# Patient Record
Sex: Male | Born: 1955 | Race: White | Hispanic: No | Marital: Married | Smoking: Current every day smoker
Health system: Southern US, Community
[De-identification: ages and names within clinical notes are randomized; demographics above are authoritative.]

## PROBLEM LIST (undated history)

## (undated) DIAGNOSIS — S1091XA Abrasion of unspecified part of neck, initial encounter: Secondary | ICD-10-CM

## (undated) DIAGNOSIS — L089 Local infection of the skin and subcutaneous tissue, unspecified: Secondary | ICD-10-CM

## (undated) DIAGNOSIS — F909 Attention-deficit hyperactivity disorder, unspecified type: Secondary | ICD-10-CM

## (undated) DIAGNOSIS — R591 Generalized enlarged lymph nodes: Secondary | ICD-10-CM

## (undated) HISTORY — DX: Abrasion of unspecified part of neck, initial encounter: S10.91XA

## (undated) HISTORY — PX: APPENDECTOMY: SHX54

## (undated) HISTORY — DX: Attention-deficit hyperactivity disorder, unspecified type: F90.9

## (undated) HISTORY — PX: VASECTOMY: SHX75

## (undated) HISTORY — DX: Local infection of the skin and subcutaneous tissue, unspecified: L08.9

## (undated) HISTORY — PX: KNEE ARTHROSCOPY: SHX127

---

## 2004-10-06 ENCOUNTER — Ambulatory Visit: Payer: Self-pay | Admitting: Family Medicine

## 2004-10-09 ENCOUNTER — Ambulatory Visit: Payer: Self-pay | Admitting: Cardiology

## 2005-10-31 ENCOUNTER — Ambulatory Visit: Payer: Self-pay | Admitting: Internal Medicine

## 2006-09-05 ENCOUNTER — Ambulatory Visit: Payer: Self-pay | Admitting: Internal Medicine

## 2006-09-05 ENCOUNTER — Encounter: Payer: Self-pay | Admitting: Internal Medicine

## 2006-09-05 DIAGNOSIS — N509 Disorder of male genital organs, unspecified: Secondary | ICD-10-CM | POA: Insufficient documentation

## 2006-09-05 LAB — CONVERTED CEMR LAB
Alkaline Phosphatase: 51 units/L (ref 39–117)
BUN: 12 mg/dL (ref 6–23)
Basophils Absolute: 0.1 10*3/uL (ref 0.0–0.1)
Chloride: 105 meq/L (ref 96–112)
Cholesterol: 191 mg/dL (ref 0–200)
Creatinine, Ser: 0.8 mg/dL (ref 0.4–1.5)
Eosinophils Absolute: 0.2 10*3/uL (ref 0.0–0.6)
Eosinophils Relative: 4.3 % (ref 0.0–5.0)
GFR calc Af Amer: 132 mL/min
MCV: 87.8 fL (ref 78.0–100.0)
Monocytes Absolute: 0.2 10*3/uL (ref 0.2–0.7)
Neutro Abs: 3.3 10*3/uL (ref 1.4–7.7)
RBC: 4.75 M/uL (ref 4.22–5.81)
Total Bilirubin: 0.7 mg/dL (ref 0.3–1.2)
Total CHOL/HDL Ratio: 5.1
Triglycerides: 78 mg/dL (ref 0–149)
WBC: 5.5 10*3/uL (ref 4.5–10.5)

## 2006-09-27 ENCOUNTER — Encounter: Admission: RE | Admit: 2006-09-27 | Discharge: 2006-09-27 | Payer: Self-pay | Admitting: Internal Medicine

## 2006-11-06 ENCOUNTER — Encounter: Payer: Self-pay | Admitting: Internal Medicine

## 2006-11-06 ENCOUNTER — Ambulatory Visit: Payer: Self-pay | Admitting: Internal Medicine

## 2006-11-26 ENCOUNTER — Encounter: Payer: Self-pay | Admitting: Internal Medicine

## 2008-09-28 ENCOUNTER — Encounter (INDEPENDENT_AMBULATORY_CARE_PROVIDER_SITE_OTHER): Payer: Self-pay | Admitting: Orthopedic Surgery

## 2008-09-28 ENCOUNTER — Ambulatory Visit: Admission: RE | Admit: 2008-09-28 | Discharge: 2008-09-28 | Payer: Self-pay | Admitting: Orthopedic Surgery

## 2008-09-28 ENCOUNTER — Ambulatory Visit: Payer: Self-pay | Admitting: Surgery

## 2009-01-19 ENCOUNTER — Emergency Department (HOSPITAL_BASED_OUTPATIENT_CLINIC_OR_DEPARTMENT_OTHER): Admission: EM | Admit: 2009-01-19 | Discharge: 2009-01-19 | Payer: Self-pay | Admitting: Emergency Medicine

## 2009-08-02 ENCOUNTER — Ambulatory Visit: Payer: Self-pay | Admitting: Internal Medicine

## 2009-08-02 ENCOUNTER — Telehealth (INDEPENDENT_AMBULATORY_CARE_PROVIDER_SITE_OTHER): Payer: Self-pay | Admitting: *Deleted

## 2009-08-02 DIAGNOSIS — R319 Hematuria, unspecified: Secondary | ICD-10-CM | POA: Insufficient documentation

## 2009-08-02 LAB — CONVERTED CEMR LAB: Bilirubin Urine: NEGATIVE

## 2009-08-03 ENCOUNTER — Encounter: Payer: Self-pay | Admitting: Internal Medicine

## 2009-08-04 ENCOUNTER — Encounter: Payer: Self-pay | Admitting: Internal Medicine

## 2009-08-04 LAB — CONVERTED CEMR LAB
Casts: NONE SEEN /lpf
RBC / HPF: NONE SEEN (ref <3)
WBC, UA: NONE SEEN cells/hpf (ref <3)

## 2009-08-05 ENCOUNTER — Ambulatory Visit: Payer: Self-pay | Admitting: Diagnostic Radiology

## 2009-08-05 ENCOUNTER — Ambulatory Visit (HOSPITAL_BASED_OUTPATIENT_CLINIC_OR_DEPARTMENT_OTHER): Admission: RE | Admit: 2009-08-05 | Discharge: 2009-08-05 | Payer: Self-pay | Admitting: Internal Medicine

## 2009-08-05 ENCOUNTER — Ambulatory Visit: Payer: Self-pay | Admitting: Internal Medicine

## 2009-08-15 ENCOUNTER — Encounter (INDEPENDENT_AMBULATORY_CARE_PROVIDER_SITE_OTHER): Payer: Self-pay | Admitting: *Deleted

## 2009-09-09 ENCOUNTER — Ambulatory Visit: Payer: Self-pay | Admitting: Internal Medicine

## 2009-09-09 DIAGNOSIS — N453 Epididymo-orchitis: Secondary | ICD-10-CM | POA: Insufficient documentation

## 2009-09-09 LAB — CONVERTED CEMR LAB
Bilirubin Urine: NEGATIVE
Glucose, Urine, Semiquant: NEGATIVE
Ketones, urine, test strip: NEGATIVE
Nitrite: NEGATIVE
Protein, U semiquant: NEGATIVE
Urobilinogen, UA: 0.2
WBC Urine, dipstick: NEGATIVE

## 2009-09-10 ENCOUNTER — Encounter: Payer: Self-pay | Admitting: Internal Medicine

## 2009-09-10 LAB — CONVERTED CEMR LAB: Casts: NONE SEEN /lpf

## 2009-09-26 ENCOUNTER — Ambulatory Visit: Payer: Self-pay | Admitting: Internal Medicine

## 2009-09-26 DIAGNOSIS — F909 Attention-deficit hyperactivity disorder, unspecified type: Secondary | ICD-10-CM | POA: Insufficient documentation

## 2009-09-29 LAB — CONVERTED CEMR LAB
BUN: 13 mg/dL (ref 6–23)
Basophils Absolute: 0.2 10*3/uL — ABNORMAL HIGH (ref 0.0–0.1)
CO2: 29 meq/L (ref 19–32)
Chloride: 106 meq/L (ref 96–112)
Creatinine, Ser: 0.8 mg/dL (ref 0.4–1.5)
Eosinophils Absolute: 0.1 10*3/uL (ref 0.0–0.7)
GFR calc non Af Amer: 107.84 mL/min (ref >60)
Glucose, Bld: 87 mg/dL (ref 70–99)
Hemoglobin: 13.4 g/dL (ref 13.0–17.0)
Monocytes Absolute: 0.8 10*3/uL (ref 0.1–1.0)
Monocytes Relative: 17.2 % — ABNORMAL HIGH (ref 3.0–12.0)
Neutro Abs: 1.4 10*3/uL (ref 1.4–7.7)
RBC: 4.5 M/uL (ref 4.22–5.81)
RDW: 13 % (ref 11.5–14.6)
TSH: 0.96 microintl units/mL (ref 0.35–5.50)

## 2009-10-07 ENCOUNTER — Ambulatory Visit: Payer: Self-pay | Admitting: *Deleted

## 2009-10-10 ENCOUNTER — Telehealth (INDEPENDENT_AMBULATORY_CARE_PROVIDER_SITE_OTHER): Payer: Self-pay | Admitting: *Deleted

## 2009-10-24 ENCOUNTER — Ambulatory Visit: Payer: Self-pay | Admitting: Internal Medicine

## 2009-10-27 ENCOUNTER — Ambulatory Visit: Payer: Self-pay | Admitting: Internal Medicine

## 2009-11-02 ENCOUNTER — Ambulatory Visit: Payer: Self-pay | Admitting: Internal Medicine

## 2009-11-02 ENCOUNTER — Telehealth (INDEPENDENT_AMBULATORY_CARE_PROVIDER_SITE_OTHER): Payer: Self-pay | Admitting: *Deleted

## 2009-11-02 LAB — CONVERTED CEMR LAB
Albumin: 4.2 g/dL (ref 3.5–5.2)
Alkaline Phosphatase: 57 units/L (ref 39–117)
Bilirubin, Direct: 0 mg/dL (ref 0.0–0.3)
Glucose, Urine, Semiquant: NEGATIVE
HDL: 42.8 mg/dL (ref >39.00)
PSA: 0.38 ng/mL (ref 0.10–4.00)
Protein, U semiquant: NEGATIVE
Total Protein: 6.9 g/dL (ref 6.0–8.3)
Triglycerides: 101 mg/dL (ref 0.0–149.0)
Urobilinogen, UA: 0.2
pH: 6

## 2009-11-03 ENCOUNTER — Encounter: Payer: Self-pay | Admitting: Internal Medicine

## 2009-11-03 LAB — CONVERTED CEMR LAB: Crystals: NONE SEEN

## 2009-11-24 ENCOUNTER — Telehealth (INDEPENDENT_AMBULATORY_CARE_PROVIDER_SITE_OTHER): Payer: Self-pay | Admitting: *Deleted

## 2009-11-30 ENCOUNTER — Ambulatory Visit: Payer: Self-pay | Admitting: Internal Medicine

## 2009-11-30 DIAGNOSIS — M549 Dorsalgia, unspecified: Secondary | ICD-10-CM | POA: Insufficient documentation

## 2009-12-07 ENCOUNTER — Encounter: Payer: Self-pay | Admitting: Internal Medicine

## 2010-01-19 ENCOUNTER — Encounter: Payer: Self-pay | Admitting: Internal Medicine

## 2010-08-01 NOTE — Letter (Signed)
Summary: chronic bacterial prostatitis--Urology    [facility]   Imported By: Lanelle Bal 12/15/2009 08:26:09  _____________________________________________________________________  External Attachment:    Type:   Image     Comment:   External Document

## 2010-08-02 NOTE — Letter (Signed)
Summary: chronic prostatitis-- Urology Specialists  [facility]   Imported By: Lanelle Bal 01/26/2010 13:54:01  _____________________________________________________________________  External Attachment:    Type:   Image     Comment:   External Document

## 2010-08-02 NOTE — Progress Notes (Signed)
Summary: re pain/Dr Drue Novel see  Phone Note Call from Patient Call back at [phone]   Caller: Patient Summary of Call: pt seen 11/02/09, signs are back pain in groin area, says woke up in a sweat last night, was told to call if signs come back. Urine cx was neg; UROLOGY REFERRAL? Initial call taken by: Christopher Moses,  Nov 24, 2009 2:24 PM  Follow-up for Phone Call        please schedule a visit with me early next week, ER if symptoms severe Christopher E. Paz MD  Nov 25, 2009 3:03 PM   Pt informed and OV scheduled .Christopher Moses  Nov 25, 2009 3:12 PM  Follow-up by: Christopher Moses,  Nov 25, 2009 3:12 PM

## 2010-08-02 NOTE — Assessment & Plan Note (Signed)
Summary: testicular pain//lch   Vital Signs:  Patient profile:   55 year old male Height:      68 inches Weight:      175.38 pounds Pulse rate:   88 / minute BP sitting:   110 / 70  Vitals Entered By: Kandice Hams (September 09, 2009 12:46 PM) CC: c/o swollen,sore prostate   History of Present Illness: one-week history of right testicular discomfort and  ?slt.  swelling the patient took over-the-counter saw palmetto and the symptoms decreased for a couple of days but they returned the right testicle is not hurting but is tender to palpation,  the size has not go down  he has some urinary frequency but that is at baseline no   injury in the genital area   Allergies: 1)  ! Codeine Sulfate (Codeine Sulfate)  Past History:  Past Medical History: Reviewed history from 08/02/2009 and no changes required. no problems   Past Surgical History: Reviewed history from 08/02/2009 and no changes required. Appendectomy  Social History: Reviewed history from 08/02/2009 and no changes required. married children x 4 Current Smoker ETOH-- socially  Review of Systems       denies fever   GI:  no nausea vomiting or diarrhea. GU:  no dysuria, hematuria, difficulty urinating no penile discharge no high-risk sexual activity.  Physical Exam  General:  alert, well-developed, and well-nourished.   Abdomen:  soft, non-tender, no distention, no masses, no guarding, no rigidity, and no inguinal hernia.  no CVA tenderness Rectal:  No external abnormalities noted. Normal sphincter tone. No rectal masses or tenderness. Genitalia:  left testicle normal to palpation, nontender right testicle slightly larger, moderately sensitive to palpation. no actual testicular mass that I can tell epididymus is quite nodular bilaterally and nontender  circumcised, no cutaneous lesions, and no urethral discharge.   Prostate:  Prostate gland firm and smooth, no enlargement, nodularity, tenderness, mass,  asymmetry or induration.   Impression & Recommendations:  Problem # 1:  ORCHITIS (ICD-604.90) Assessment New symptoms and  physical exam consistent with orchitis the right testicle is  slightly larger however the patient states that that has been the case for a while Plan: UA and urine culture, start antibiotics if the symptoms continue will get a ultrasound of the scrotum reassess in 4  weeks     Problem # 2:  due for a physical exam, patient aware  Complete Medication List: 1)  Neurontin 300 Mg Caps (Gabapentin) .... One by mouth at bedtime 2)  Ciprofloxacin Hcl 500 Mg Tabs (Ciprofloxacin hcl) .... One by mouth twice a day  Other Orders: Specimen Handling (04540) T-Urine Microscopic ([id number]) T-Culture, Urine ([id number]) UA Dipstick w/o Micro (manual) (78469)  Patient Instructions: 1)  Please schedule a follow-up appointment in 4  weeks.  Prescriptions: CIPROFLOXACIN HCL 500 MG TABS (CIPROFLOXACIN HCL) one by mouth twice a day  #20 x 0   Entered and Authorized by:   Nolon Rod. Asianna Brundage MD   Signed by:   Nolon Rod. Cordney Barstow MD on 09/09/2009   Method used:   Print then Give to Patient   RxID:   [id number]   Laboratory Results   Urine Tests    Routine Urinalysis   Glucose: negative   (Normal Range: Negative) Bilirubin: negative   (Normal Range: Negative) Ketone: negative   (Normal Range: Negative) Spec. Gravity: 1.020   (Normal Range: 1.003-1.035) Blood: small   (Normal Range: Negative) pH: 7.0   (Normal Range: 5.0-8.0) Protein: negative   (Normal  Range: Negative) Urobilinogen: 0.2   (Normal Range: 0-1) Nitrite: negative   (Normal Range: Negative) Leukocyte Esterace: negative   (Normal Range: Negative)

## 2010-08-02 NOTE — Progress Notes (Signed)
Summary: lab results  Phone Note Outgoing Call Call back at Work Phone [phone] Call back at [phone]   Details for Reason: LAB RESULTS: advise patient: is cholesterol is very high, LDL is 175.9, ideal LDL should be close to 100 (in light of his family history plus tobacco abuse) In addition to diet and exercise I recommend simvastatin 40 mg daily FLP , AST ALT in 6 weeks Signed by Elita Quick E. Paz MD on 11/02/2009 at 10:25 AM  Summary of Call: left message on machine for pt to return call copy of labs, rx mailed to pt Shary Decamp  Nov 02, 2009 11:02 AM discussed with pt Shary Decamp  Nov 02, 2009 11:59 AM     New/Updated Medications: SIMVASTATIN 40 MG TABS (SIMVASTATIN) 1 by mouth at bedtime - DUE LAB WORK IN 6 WEEKS (JUNE 2011) Prescriptions: SIMVASTATIN 40 MG TABS (SIMVASTATIN) 1 by mouth at bedtime - DUE LAB WORK IN 6 WEEKS (JUNE 2011)  #30 x 1   Entered by:   Shary Decamp   Authorized by:   Nolon Rod. Paz MD   Signed by:   Shary Decamp on 11/02/2009   Method used:   Print then Mail to Patient   RxID:   [id number]

## 2010-08-02 NOTE — Assessment & Plan Note (Signed)
Summary: TENDERNESS ON LEFT SIDE/KDC   Vital Signs:  Patient profile:   55 year old male Height:      68 inches Weight:      174.2 pounds BMI:     26.58 Pulse rate:   80 / minute BP sitting:   110 / 68  Vitals Entered By: rachel peeler CC: lower back pain Comments pt. complains of lower back pain on left side x2wks, tender to touch, gassy pt. has had shingles before and it feels like the same thing, but no rash   History of Present Illness: pt. complains left flank pain x2wks, the pain is like a soreness with light touch it is more noticeable with light touch than  with deep palpation symptoms are on and off, not  made worse  by bending or twisting pt. has had shingles few years ago  and it feels like the same thing, but no rash this time besides that, he feels "gassy"  like his stomach is making noises.  Allergies (verified): 1)  ! Codeine Sulfate (Codeine Sulfate)  Past History:  Past Medical History: no problems   Past Surgical History: Appendectomy  Social History: married children x 4 Current Smoker ETOH-- socially  Review of Systems       denies fever no nausea, vomiting, diarrhea or blood in the stools no dysuria, gross hematuria, testicular pain appetite is normal no GERD symptoms  Physical Exam  General:  alert, well-developed, and well-nourished.   Neck:  no lymphadenopathies Lungs:  normal respiratory effort, no intercostal retractions, no accessory muscle use, and normal breath sounds.   Heart:  normal rate, regular rhythm, no murmur, and no gallop.   Abdomen:  soft, non-tender, normal bowel sounds, no distention, no masses, no guarding, and no rigidity. he is a slightly sensitive to coach in the left flank  Rectal:  No external abnormalities noted. Normal sphincter tone. No rectal masses or tenderness. Hemoccult negative Prostate:  Prostate gland firm and smooth, no enlargement, nodularity, tenderness, mass, asymmetry or induration. Skin:  no  rash in the back or abdomen   Impression & Recommendations:  Problem # 1:  FLANK PAIN, LEFT (ICD-789.09) Assessment New atypical flank pain. Symptoms are not necessarily consistent with a kidney stone, by description they sound more neuropathic, this also could be early shingles  Urinalyses show a trace of blood he is a heavy smoker Plan: abdominal ultrasound urine culture Neurontin at bedtime reassess in 4 weeks to call if rash   Orders: Radiology Referral (Radiology)  Complete Medication List: 1)  Neurontin 300 Mg Caps (Gabapentin) .... One by mouth at bedtime  Other Orders: Specimen Handling (11914) T-Culture, Urine ([id number]) T-Urine Microscopic ([id number]) UA Dipstick w/o Micro (manual) (95284)  Patient Instructions: 1)  Please schedule a follow-up appointment in 4 weeks (fasting, physical) Prescriptions: NEURONTIN 300 MG CAPS (GABAPENTIN) one by mouth at bedtime  #30 x 0   Entered and Authorized by:   Nolon Rod. Zakk Borgen MD   Signed by:   Nolon Rod. Keyuna Cuthrell MD on 08/02/2009   Method used:   Print then Give to Patient   RxID:   [id number]   Laboratory Results   Urine Tests    Routine Urinalysis   Glucose: negative   (Normal Range: Negative) Bilirubin: negative   (Normal Range: Negative) Ketone: negative   (Normal Range: Negative) Spec. Gravity: 1.015   (Normal Range: 1.003-1.035) Blood: trace-lysed   (Normal Range: Negative) pH: 6.5   (Normal Range: 5.0-8.0) Protein: negative   (  Normal Range: Negative) Urobilinogen: 1.0   (Normal Range: 0-1) Nitrite: negative   (Normal Range: Negative) Leukocyte Esterace: negative   (Normal Range: Negative)

## 2010-08-02 NOTE — Progress Notes (Signed)
Summary: [facility] 4/11  Phone Note Outgoing Call Call back at [phone]   Summary of Call: I believe Mrs. Troxler last week, she evaluated  the patient, she suspects bipolar disorder rather than ADHD. Plan: Refer to psychiatry to clarify the diagnoses please arrange the  referral and notify the patient Jose E. Paz MD  October 10, 2009 8:56 AM  discussed with pt -- referral done Shary Decamp  October 10, 2009 3:53 PM

## 2010-08-02 NOTE — Letter (Signed)
Summary: *Referral Letter  [facility]  [address] [place], [place] [address]   Phone: [phone]  Fax: [phone]    11/30/2009  Urology  Thank you in advance for agreeing to see my patient:  Christopher Moses [address] [place], [place]  [address]  Phone: [phone]  Reason for Referral:  The patient has back pain and ill-defined testicular discomfort. The symptoms usually get better after a treatment with antibiotics. We did the ultrasound of the abdomen back in February 2011 to workup his symptoms and it was essentially normal. Please help me  decided if he has any illness related to the urinary tract that could account for his symptoms. I am planning to enclose my last 2 or 3 office visit notes, results of recent labs and urine tests.    Current Medications: 1)  * VYVANSE (per psych) 2)  ASPIRIN 81 MG TBEC (ASPIRIN) one by mouth daily 3)  SIMVASTATIN 40 MG TABS (SIMVASTATIN) 1 by mouth at bedtime - DUE LAB WORK IN 6 WEEKS (JUNE 2011)   Past Medical History: 1)  Dx w/ ADHD by psychiatry 4-11      Thank you again for agreeing to see our patient; please contact us if you have any further questions or need additional information.  Sincerely,      Jose E. Paz MD

## 2010-08-02 NOTE — Progress Notes (Signed)
Summary: NEEDS PHYSICAL IN FOUR WEEKS  Phone Note Call from Patient Call back at CELL [phone]   Caller: Patient Summary of Call: DR PAZ WANTS PATIENT TO COME IN IN FOUR WEEKS FOR A PHYSICAL AND FASTING LABS---WHERE SO YOU WANT TO PUT HIM??  THURSDAYS AND FRIDAYS ARE BEST Initial call taken by: Jerolyn Shin,  August 02, 2009 5:20 PM  Follow-up for Phone Call        Enrique Sack -- can he wait until March, ok to work him in on a thurs or fri in march thx Shary Decamp  August 03, 2009 4:27 PM     Additional Follow-up for Phone Call Additional follow up Details #2::    [email or web address] KitchenMarland KitchenBarb Merino  August 04, 2009 9:35 AM  mailed a letter   Follow-up by: Barb Merino,  August 04, 2009 9:35 AM

## 2010-08-02 NOTE — Assessment & Plan Note (Signed)
Summary: groin pain/alr   Vital Signs:  Patient profile:   55 year old male Height:      68 inches Weight:      171 pounds Temp:     97.8 degrees F oral Pulse rate:   82 / minute BP sitting:   130 / 82  (left arm)  Vitals Entered By: Jeremy Johann CMA (November 30, 2009 12:01 PM) CC: pain in groin area x35month Comments REVIEWED MED LIST, PATIENT AGREED DOSE AND INSTRUCTION CORRECT    History of Present Illness: was seen recently with back pain, please see previous note. He was treated with Cipro for possible orchitis. Today the patient reports that during the time he was taking the antibiotic, he felt better, shortly after he finished Cipro the ill-defined lower back pain as well as testicular discomfort came back. After researching Internet, he thinks he has prostatitis  Review of systems Occasionally feels chills at night, no fever Denies any dysuria or gross hematuria Denies any difficulty urinating.  Allergies: 1)  ! Codeine Sulfate (Codeine Sulfate)  Past History:  Past Medical History: Reviewed history from 10/24/2009 and no changes required. Dx w/ ADHD by psychiatry 4-11  Past Surgical History: Reviewed history from 10/24/2009 and no changes required. Appendectomy knee scope **L**  Social History: Reviewed history from 10/24/2009 and no changes required. 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   Rectal:  No external abnormalities noted. Normal sphincter tone. No rectal masses or tenderness. Genitalia:  --left testicle normal to palpation, nontender --right testicle slightly larger, not tender -- no actual testicular mass that I can tell --epididymus is quite nodular bilaterally and nontender . dominant nodule at the right epididymis?     Prostate:  Prostate gland firm and smooth, no enlargement, nodularity, tenderness, mass, asymmetry or induration. Msk:  note tender to palpation in the back   Impression & Recommendations:  Problem # 1:  BACK PAIN (ICD-724.5)  the patient continued with back pain along with ill-defined testicular discomfort Workup so far included a negative abdominal  ultrasound 2-11. on further chart review, he also had a benign  ultrasound of the scrotum in 2008. patient believes  symptoms are due to prostatitis, the DRE is essentially normal Plan:  Urology referral if he is clear by them, will treat him as a muscleskeletal pain  His updated medication list for this problem includes:    Aspirin 81 Mg Tbec (Aspirin) ..... One by mouth daily  Orders: Urology Referral (Urology)  Complete Medication List: 1)  Vyvanse  .... (per psych) 2)  Aspirin 81 Mg Tbec (Aspirin) .... One by mouth daily 3)  Simvastatin 40 Mg Tabs (Simvastatin) .Marland Kitchen.. 1 by mouth at bedtime - due lab work in 6 weeks (june 2011)

## 2010-08-02 NOTE — Assessment & Plan Note (Signed)
Summary: discomfort above belly /kdc   Vital Signs:  Patient profile:   55 year old male Height:      68 inches Weight:      178.4 pounds Temp:     98.1 degrees F BP sitting:   120 / 80  Vitals Entered By: Shary Decamp (August 05, 2009 11:54 AM) CC: still with pain, left side   History of Present Illness: yesterday he developed soreness to touch close to  the epigastric area   Current Medications (verified): 1)  Neurontin 300 Mg Caps (Gabapentin) .... One By Mouth At Bedtime  Allergies (verified): 1)  ! Codeine Sulfate (Codeine Sulfate)  Past History:  Past Medical History: Reviewed history from 08/02/2009 and no changes required. no problems   Past Surgical History: Reviewed history from 08/02/2009 and no changes required. Appendectomy  Review of Systems       denies fever, heartburn, nausea vomiting. Bowel movements are normal good appetite left flank pain is still  there but not as severe.  No rash  Physical Exam  General:  alert and well-developed.   Lungs:  normal respiratory effort, no intercostal retractions, no accessory muscle use, and normal breath sounds.   Heart:  normal rate and regular rhythm.   Abdomen:  soft, non-tender, no distention, no masses, no guarding, and no rigidity.  he has a 3 x 2 cm bruise in the area of pain Msk:  no bruises or ecchymosis in the back   Impression & Recommendations:  Problem # 1:  ABDOMINAL PAIN OTHER SPECIFIED SITE (ICD-789.09) pain is likely due to a contusion in the abdominal wall on looking back, the patient thinks that he hit  his abdomen at work, he works with scaffolds. Plan: Observation  Problem # 2:  FLANK PAIN, LEFT (ICD-789.09) urine culture negative  ultrasound today  Complete Medication List: 1)  Neurontin 300 Mg Caps (Gabapentin) .... One by mouth at bedtime

## 2010-08-02 NOTE — Assessment & Plan Note (Signed)
Summary: CPX//PH   Vital Signs:  Patient profile:   55 year old male Height:      68 inches Weight:      178.6 pounds BMI:     27.25 Pulse rate:   74 / minute BP sitting:   120 / 70  Vitals Entered By: Shary Decamp (October 24, 2009 3:21 PM) CC: cpx - not fasting   History of Present Illness: here for a complete physical  he saw a psychiatrist today, she was diagnosed with ADHD, no bipolar issues.  Will start a medication as prescribed by psychiatry He was also recently seen with orchitis, symptoms completely went away after the antibiotics, now the right testicle continued to be slightly sensitive  Current Medications (verified): 1)  Vyvanse .... (Per Psych)  Allergies (verified): 1)  ! Codeine Sulfate (Codeine Sulfate)  Past History:  Past Medical History: Dx w/ ADHD by psychiatry 4-11  Past Surgical History: Appendectomy knee scope **L**  Family History: MI-- F , CAD dx at age 34, had a MI at age 56 (died) DM--no colon ca-- no prostate ca-- uncle dx age 5  Social History: Reviewed history from 09/26/2009 and no changes required. 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 CV:  Denies chest pain or discomfort and swelling of feet. Resp:  Denies cough and shortness of breath. GI:  Denies bloody stools, diarrhea, nausea, and vomiting. GU:  Denies dysuria, hematuria, urinary frequency, and urinary hesitancy.  Physical Exam  General:  alert and well-developed.   Neck:  no masses, no thyromegaly, and normal carotid upstroke.   Lungs:  normal respiratory effort, no intercostal retractions, no accessory muscle use, and normal breath sounds.   Heart:  normal rate, regular rhythm, no murmur, and no gallop.   Abdomen:  soft, non-tender, no  distention, no masses, no guarding, and no rigidity.   Rectal:  done 09-09-09 Genitalia:  --left testicle normal to palpation, nontender --right testicle slightly larger, not tender -- no actual testicular mass that I can tell --epididymus is quite nodular bilaterally and nontender    Prostate:  done 09-09-09 ( normal) Extremities:  no edema Psych:  Oriented X3, memory intact for recent and remote, normally interactive, good eye contact, not anxious appearing, and not depressed appearing.     Impression & Recommendations:  Problem # 1:  ROUTINE GENERAL MEDICAL EXAM@HEALTH  CARE FACL (ICD-V70.0) Td 08 EKG today normal  sinus rhythm counseling about diet and  exercise he has a strong family history heart disease, he is a heavy smoker.  This was discussed with the patient, I recommend him to quit tobacco as the main thing he can do to prevent or delay cardiovascular events Will check his cholesterol, if he is moderately elevated he likely will benefit from medication start aspirin Colonoscopy Vs.iFOB cards reviewed w/ pt. Provided  iFOB but he will  call if he decides to have a  colonoscopy   Orders: EKG w/ Interpretation (93000)  Problem # 2:  ADHD (ICD-314.01) dx by psychiatry today  Problem # 3:  ORCHITIS (ICD-604.90) recently seen with orchitis, become asymptomatic after antibiotics, now has lingering mild discomfort in the right testicle scrotal ultrasound was ordered 09/09/09 but it  was not done because he had a  ultrasound in 2008. at  this point I will recommend observation, no testicular mass on exam, he does have enlarged  normal epididymis bilaterally, likely d/t  cysts. Patient to let me know if discomfort in the testicle persist or if he has swelling    Complete Medication List: 1)  Vyvanse  .... (per psych) 2)  Aspirin 81 Mg Tbec (Aspirin) .... One by mouth daily  Patient Instructions: 1)  start an aspirin every day 2)  come back fasting: 3)  FLP, PSA, LFTs x v70 4)   let you know if it dyscomfort  in the testicle continue or if it  gets worse or if the testicle  swells  up 5)  Please schedule a follow-up appointment in 1 year.      Immunization History:  Tetanus/Td Immunization History:    Tetanus/Td:  tdap (09/05/2006)

## 2010-08-02 NOTE — Assessment & Plan Note (Signed)
Summary: back discomfort//kidney area//lch   Vital Signs:  Patient profile:   55 year old male Height:      68 inches Weight:      178 pounds Temp:     97.7 degrees F BP sitting:   126 / 70  Vitals Entered By: Shary Decamp (Nov 02, 2009 3:00 PM) CC: low back pain x 3 days   History of Present Illness: complaining of back pain on and off, it increased with certain positions; no radiation states that it hurts the same way he did when he had orchitis  Current Medications (verified): 1)  Vyvanse .... (Per Psych) 2)  Aspirin 81 Mg Tbec (Aspirin) .... One By Mouth Daily 3)  Simvastatin 40 Mg Tabs (Simvastatin) .Marland Kitchen.. 1 By Mouth At Bedtime - Due Lab Work in 6 Weeks (June 2011)  Allergies (verified): 1)  ! Codeine Sulfate (Codeine Sulfate)  Past History:  Past Medical History: Reviewed history from 10/24/2009 and no changes required. Dx w/ ADHD by psychiatry 4-11  Past Surgical History: Reviewed history from 10/24/2009 and no changes required. Appendectomy knee scope **L**  Review of Systems        denies fevers no dysuria or hematuria no conjunctivitis type of symptoms  Physical Exam  General:  alert and well-developed.   Eyes:   no redness Abdomen:  soft and non-tender.   Neurologic:  alert & oriented X3, strength normal in all extremities, and gait normal.  lower extremity DTRs normal except for the  right knee jerk which is slight decreased.  Straight leg test negative   Impression & Recommendations:  Problem # 1:  back pain symptoms suggest a musculoskeletal problem, the patient however is convinced his symptoms are related to orchitis. states that when he was diagnosed with orchitis a few weeks ago, he had similar symptoms, he took antibiotics and the  testicular pain and back pain completely went away.  This symptom has slowly coming  back.  He again has mild testicular discomfort. After a long discussion, he declined to be referred to a chiropractor and we  agreed to do a second round of Cipro to treat possibly orchitis/low-grade prostatitis which may be causing back discomfort  Problem # 2:  ORCHITIS (ICD-604.90) see #1 if not better with his second round of Cipro or if he gets better  and then  symptoms come back, he will call for urology referral  Problem # 3:  HEMATURIA UNSPECIFIED (ICD-599.70) udip showing microscopic hematuria, if this is confirmed by urinalysis, he will need a urological referral   His updated medication list for this problem includes:    Ciprofloxacin Hcl 500 Mg Tabs (Ciprofloxacin hcl) .Marland Kitchen... 1 by mouth two times a day  Orders: Specimen Handling ([address]) T-Urine Microscopic (60454-09811) T-Culture, Urine (91478-29562) UA Dipstick w/o Micro (manual) (81002)  Complete Medication List: 1)  Vyvanse  .... (per psych) 2)  Aspirin 81 Mg Tbec (Aspirin) .... One by mouth daily 3)  Simvastatin 40 Mg Tabs (Simvastatin) .Marland Kitchen.. 1 by mouth at bedtime - due lab work in 6 weeks (june 2011) 4)  Ciprofloxacin Hcl 500 Mg Tabs (Ciprofloxacin hcl) .Marland Kitchen.. 1 by mouth two times a day Prescriptions: CIPROFLOXACIN HCL 500 MG TABS (CIPROFLOXACIN HCL) 1 by mouth two times a day  #20 x 0   Entered and Authorized by:   Nolon Rod. Casee Knepp MD   Signed by:   Nolon Rod. Javarian Jakubiak MD on 11/02/2009   Method used:   Print then Give to Patient  RxID:   [id number]   Laboratory Results   Urine Tests    Routine Urinalysis   Glucose: negative   (Normal Range: Negative) Bilirubin: negative   (Normal Range: Negative) Ketone: negative   (Normal Range: Negative) Spec. Gravity: 1.025   (Normal Range: 1.003-1.035) Blood: large   (Normal Range: Negative) pH: 6.0   (Normal Range: 5.0-8.0) Protein: negative   (Normal Range: Negative) Urobilinogen: 0.2   (Normal Range: 0-1) Nitrite: negative   (Normal Range: Negative) Leukocyte Esterace: negative   (Normal Range: Negative)

## 2010-08-02 NOTE — Letter (Signed)
Summary: Primary Care Appointment Letter  [facility] [place]  [address] [place], [place] [address]   Phone: [phone]  Fax: [phone]    08/15/2009 MRN: [id number]  Christopher Moses [address] [place], [place]  [address]  Dear Christopher Moses,   Your Primary Care Physician [place] E. Paz MD has indicated that:    ___x____it is time to schedule an appointment.Please call and schedule a physical in March    _______you missed your appointment on______ and need to call and          reschedule.    _______you need to have lab work done.    _______you need to schedule an appointment discuss lab or test results.    _______you need to call to reschedule your appointment that is                       scheduled on _________.     Please call our office as soon as possible. Our phone number is [phone]_______. Please press option 1. Our office is open 8a-12noon and 1p-5p, Monday through Friday.     Thank you,    [facility]

## 2010-08-02 NOTE — Assessment & Plan Note (Signed)
Summary: DISCUSS ADHD/KDC   Vital Signs:  Patient profile:   55 year old male Height:      68 inches Weight:      182.4 pounds Pulse rate:   72 / minute BP sitting:   120 / 80  Vitals Entered By: Shary Decamp (September 26, 2009 3:19 PM) CC: discuss ADHA   History of Present Illness: patient suspected he has ADHD for years, recently his song was diagnosed with ADHD and he likes to be tested and  treated long history of inability to finish tasks, sidetracked easily, procrastinates a lot  Current Medications (verified): 1)  None  Allergies (verified): 1)  ! Codeine Sulfate (Codeine Sulfate)  Past History:  Past Medical History: Reviewed history from 08/02/2009 and no changes required. no problems   Past Surgical History: Reviewed history from 08/02/2009 and no changes required. 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   Lungs:  normal respiratory effort, no intercostal retractions, no accessory muscle use, and normal breath sounds.   Heart:  normal rate and regular rhythm.   Extremities:  no edema Psych:  Oriented X3, memory intact for recent and remote, normally interactive, good eye contact, not anxious appearing, and not depressed appearing.     Impression & Recommendations:  Problem # 1:  ADHD (ICD-314.01) patient presented with symptoms consistent with ADHD he is 55 and has not been diagnosed w/ ADHD so far, likely because he did not go to college and consequently the diagnosis was not suspected Plan: Referred to psychology for a one time visit  to clarify diagnoses start medications if the diagnoses is confirmed basic labs to rule out underlying  disease  Orders: Venipuncture (04540) TLB-BMP (Basic Metabolic Panel-BMET) (80048-METABOL) TLB-CBC Platelet - w/Differential (85025-CBCD) TLB-TSH (Thyroid Stimulating Hormone) (98119-JYN) Psychology Referral (Psychology)

## 2010-08-30 ENCOUNTER — Ambulatory Visit (INDEPENDENT_AMBULATORY_CARE_PROVIDER_SITE_OTHER): Payer: BC Managed Care – PPO | Admitting: Internal Medicine

## 2010-08-30 ENCOUNTER — Encounter: Payer: Self-pay | Admitting: Internal Medicine

## 2010-08-30 ENCOUNTER — Other Ambulatory Visit: Payer: Self-pay | Admitting: Internal Medicine

## 2010-08-30 ENCOUNTER — Ambulatory Visit (INDEPENDENT_AMBULATORY_CARE_PROVIDER_SITE_OTHER)
Admission: RE | Admit: 2010-08-30 | Discharge: 2010-08-30 | Disposition: A | Discharge status: Home / Self Care | Payer: BC Managed Care – PPO | Source: Ambulatory Visit | Attending: Internal Medicine | Admitting: Internal Medicine

## 2010-08-30 ENCOUNTER — Ambulatory Visit
Admission: RE | Admit: 2010-08-30 | Discharge: 2010-08-30 | Disposition: A | Discharge status: Home / Self Care | Payer: BC Managed Care – PPO | Source: Ambulatory Visit | Attending: Internal Medicine | Admitting: Internal Medicine

## 2010-08-30 DIAGNOSIS — M1990 Unspecified osteoarthritis, unspecified site: Secondary | ICD-10-CM

## 2010-09-07 NOTE — Assessment & Plan Note (Signed)
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 Ever since then he complains of R  hand pain, mostly @  his knuckles. The hand also feels tight   review of systems  no redness no hand tingling or paresthesias No neck pain or elbow pain No chest pain He admits that his right hand gets pale from time to time, usually when he is outdoors , has not notice his hand to get red or flushed.  Current Medications (verified): 1)  Vyvanse .... (Per Psych)  Allergies (verified): 1)  ! Codeine Sulfate (Codeine Sulfate)  Past History:  Past Medical History: Reviewed history from 10/24/2009 and no changes required. Dx w/ ADHD by psychiatry 4-11  Past Surgical History: Reviewed history from 10/24/2009 and no changes required. Appendectomy knee scope **L**  Social History: Reviewed history from 10/24/2009 and no changes required. 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   Neck:  full ROM.   nontender to palpation Pulses:   normal radial pulses bilaterally Extremities:  no pretibial edema bilaterally  Right hand compared to the left is  slightly swollen, a very subtle finding. there is no redness or a rash, good capillary refill.  knuckles  are not particularly red or puffy. no puffy PIPs, DIPs    Impression & Recommendations:  Problem # 1:  DEGENERATIVE JOINT DISEASE (ICD-715.90)  right-handed pain and tightness since December when he overuse his hand. Review of systems ---->  the hand gets occasionally pale  swelling and pain may be related to overuse, DJD?  also he may be developing Raynaud phenomena For now I recommend to x-rays, use meloxicam and observation. If symptoms increase or if he has a frank Raynaud's syndrome symptoms he will let me know. consider ortho or rheumatology referral His updated medication list for this problem includes:    Meloxicam 15 Mg Tabs (Meloxicam) .Marland Kitchen... 1 by mouth once daily with food as needed pain  Orders: T-Hand Left 3 Views (73130TC) T-Hand Right 3 views (73130TC)  Complete Medication List: 1)  Vyvanse  .... (per psych) 2)  Meloxicam 15 Mg Tabs (Meloxicam) .Marland Kitchen.. 1 by mouth once daily with food as needed pain  Patient Instructions: 1)  XRs 2)  meloxicam once a day x 2 weeks, then as needed , watch for stomach irritation  3)  call if no better  Prescriptions: MELOXICAM 15 MG TABS (MELOXICAM) 1 by mouth once daily with food as needed pain  #30 x 1   Entered and Authorized by:   Elita Quick E. Hazell Siwik MD   Signed by:  Mahrosh Donnell E. Taishaun Levels MD on 08/30/2010   Method used:   Print then Give to Patient   RxID:   [id number]    Orders Added: 1)  T-Hand Left 3 Views [73130TC] 2)  T-Hand Right 3 views [73130TC] 3)  Est. Patient Level III [56213]

## 2010-11-22 ENCOUNTER — Encounter: Payer: Self-pay | Admitting: Internal Medicine

## 2010-11-22 ENCOUNTER — Ambulatory Visit (INDEPENDENT_AMBULATORY_CARE_PROVIDER_SITE_OTHER): Payer: BC Managed Care – PPO | Admitting: Internal Medicine

## 2010-11-22 VITALS — BP 128/82 | HR 90 | Wt 163.0 lb

## 2010-11-22 DIAGNOSIS — M79609 Pain in unspecified limb: Secondary | ICD-10-CM

## 2010-11-22 DIAGNOSIS — M1990 Unspecified osteoarthritis, unspecified site: Secondary | ICD-10-CM

## 2010-11-22 DIAGNOSIS — M79643 Pain in unspecified hand: Secondary | ICD-10-CM

## 2010-11-22 LAB — CBC WITH DIFFERENTIAL/PLATELET
Basophils Relative: 0.6 % (ref 0.0–3.0)
Eosinophils Relative: 4.2 % (ref 0.0–5.0)
Hemoglobin: 12.6 g/dL — ABNORMAL LOW (ref 13.0–17.0)
Lymphocytes Relative: 23.9 % (ref 12.0–46.0)
Lymphs Abs: 1.5 10*3/uL (ref 0.7–4.0)
MCV: 90.3 fl (ref 78.0–100.0)
Monocytes Absolute: 0.4 10*3/uL (ref 0.1–1.0)
Neutro Abs: 4.1 10*3/uL (ref 1.4–7.7)
Platelets: 339 10*3/uL (ref 150.0–400.0)
RBC: 4.18 Mil/uL — ABNORMAL LOW (ref 4.22–5.81)
RDW: 14.4 % (ref 11.5–14.6)

## 2010-11-22 LAB — SEDIMENTATION RATE: Sed Rate: 10 mm/hr (ref 0–22)

## 2010-11-22 NOTE — Patient Instructions (Signed)
We are referring you to a hand specialist. Call if you don't hear from [place] in 5 days

## 2010-11-22 NOTE — Progress Notes (Signed)
  Subjective:    Patient ID: Christopher Moses, male    DOB: April 26, 1956, 55 y.o.   MRN: [id number]  HPI  He was seen in February with bilateral hand pain and swelling. X-rays were essentially normal except for the right hand: Subchondral cystic change or possible erosions involving the third metacarpal head.  Here because he's not any better, continued to wake up with the whole right hand feeling swollen , tingling and achy. The left hand is nearly asymptomatic. He also has a hard time fully flexing his right fourth digit.  Past Medical History  Diagnosis Date  . ADHD (attention deficit hyperactivity disorder)     By psych 09/2009   Past Surgical History  Procedure Date  . Appendectomy   . Knee arthroscopy     Left       Review of Systems Denies any changes in the color of his hands (pale, red) Denies any backache, conjunctivitis, rash.  no other joints involved.     Objective:   Physical Exam Alert oriented x3. Wrists are bilaterally normal Left hand and wrist normal to inspection on palpation. Vascular exam normal. Right hand slightly puffy compared to the left. No obvious synovitis on exam. Vascular exam normal. He does have a difficult time flexing his fourth digit. No trigger phenomena observed         Assessment & Plan:

## 2010-11-22 NOTE — Assessment & Plan Note (Addendum)
Hand swelling and  discomfort that started around December 2011 after he over used his hands. At this point the left hand is better but he still has symptoms on the right.  DDX  includes DJD, rheumatoid arthritis, or other inflammatory arthritis. He also has some tingling, carpal tunnel syndrome? Plan: Labs Hand specialist referral, may need nerve conduction studies and further workup. Considering  referral to rheumatology as well.

## 2010-11-23 ENCOUNTER — Telehealth: Payer: Self-pay | Admitting: *Deleted

## 2010-11-23 NOTE — Telephone Encounter (Signed)
Message copied by Army Fossa on Thu Nov 23, 2010  8:40 AM ------      Message from: Willow Ora      Created: Wed Nov 22, 2010  5:25 PM       Please add or redraw a iron-ferritin-b12-folic acid-----dx anemia

## 2010-11-23 NOTE — Telephone Encounter (Signed)
Message left for patient to return my call.  

## 2010-11-23 NOTE — Telephone Encounter (Signed)
Please call pt and have schedule the below labs.

## 2010-11-24 ENCOUNTER — Telehealth: Payer: Self-pay | Admitting: *Deleted

## 2010-11-24 NOTE — Telephone Encounter (Signed)
Message left for patient to return my call. Pt will need to come back for lab.

## 2010-11-24 NOTE — Telephone Encounter (Signed)
Labs did not show any major inflammation. He does have mild anemia, please add or redraw a iron and ferritin--dx anemia. If he has iron deficiency, will need GI referral. Please forward results to orthopedic surgery, he was refered  Message left for patient to return my call.

## 2010-11-24 NOTE — Telephone Encounter (Signed)
Message copied by [name] on Fri Nov 24, 2010  9:58 AM ------      Message from: Christopher Moses      Created: Thu Nov 23, 2010  6:13 PM       Advise patient:      Labs did not show any major inflammation.      He does have mild anemia, please add or redraw a  iron and ferritin--dx anemia. If he has iron deficiency, will need GI referral.      Please forward results to orthopedic surgery, he was refered

## 2010-11-28 NOTE — Telephone Encounter (Signed)
Message left for patient to return my call.  

## 2010-11-29 NOTE — Telephone Encounter (Signed)
I spoke w/ pt he is aware, he is coming back for labs.

## 2010-11-30 ENCOUNTER — Other Ambulatory Visit: Payer: Self-pay | Admitting: Internal Medicine

## 2010-11-30 DIAGNOSIS — D649 Anemia, unspecified: Secondary | ICD-10-CM

## 2010-12-01 ENCOUNTER — Other Ambulatory Visit (INDEPENDENT_AMBULATORY_CARE_PROVIDER_SITE_OTHER): Payer: BC Managed Care – PPO

## 2010-12-01 DIAGNOSIS — D649 Anemia, unspecified: Secondary | ICD-10-CM

## 2010-12-01 LAB — FERRITIN: Ferritin: 35.8 ng/mL (ref 22.0–322.0)

## 2010-12-01 LAB — IRON: Iron: 75 ug/dL (ref 42–165)

## 2010-12-01 NOTE — Progress Notes (Signed)
LABS ONLY  

## 2010-12-04 ENCOUNTER — Telehealth: Payer: Self-pay | Admitting: *Deleted

## 2010-12-04 NOTE — Telephone Encounter (Signed)
Message left for patient to return my call.  

## 2010-12-04 NOTE — Telephone Encounter (Signed)
Message copied by [name] on Mon Dec 04, 2010  1:49 PM ------      Message from: Christopher Moses      Created: Mon Dec 04, 2010  1:32 PM       Advise patient, iron is normal.

## 2010-12-05 NOTE — Telephone Encounter (Signed)
Message left for patient to return my call.  

## 2010-12-06 NOTE — Telephone Encounter (Signed)
Spoke w/ pts wife Tresa Endo she is aware of lab results.

## 2011-05-07 ENCOUNTER — Ambulatory Visit (INDEPENDENT_AMBULATORY_CARE_PROVIDER_SITE_OTHER): Payer: BC Managed Care – PPO | Admitting: Internal Medicine

## 2011-05-07 ENCOUNTER — Encounter: Payer: Self-pay | Admitting: Internal Medicine

## 2011-05-07 VITALS — BP 138/76 | HR 83 | Temp 98.5°F | Resp 18 | Ht 68.0 in | Wt 161.2 lb

## 2011-05-07 DIAGNOSIS — T7840XA Allergy, unspecified, initial encounter: Secondary | ICD-10-CM

## 2011-05-07 MED ORDER — BETAMETHASONE DIPROPIONATE 0.05 % EX LOTN: TOPICAL_LOTION | Freq: Two times a day (BID) | CUTANEOUS | Status: AC

## 2011-05-07 MED ORDER — PREDNISONE 10 MG PO TABS: ORAL_TABLET | ORAL | Status: AC

## 2011-05-07 NOTE — Progress Notes (Signed)
  Subjective:    Patient ID: Christopher Moses, male    DOB: [birth year], 55 y.o.   MRN: [id number]  HPI 3 days ago developed hives in the scalp and forehead, they were extremely itchy. She then may have to stop itching but the swelling persists.   Past Medical History  Diagnosis Date  . ADHD (attention deficit hyperactivity disorder)     By psych 09/2009   Past Surgical History  Procedure Date  . Appendectomy   . Knee arthroscopy     Left     Review of Systems No taken any new medicines, denies the use of any new shampoo, not using any new hat or clothing. No lip or tongue swelling. No shortness or breath No fever or chills.     Objective:   Physical Exam  HENT:  Head:     Alert oriented in no apparent distress. Scalp with a few maculopapular erythematous lesions on the top, at the forehead he has 3 or 4 papular lesions, no fluctuant , soft, no tender, slt red. No axillary or cervical lymphadenopathies. Face is symmetric without rash      Assessment & Plan:  Allergic reaction: The patient most likely has an allergic reaction, etiology unclear. The distribution of the lesions suggest something related to a hat  or bandana. Plan: Steroids, Claritin, it got better will recommend a dermatology evaluation.

## 2011-05-07 NOTE — Patient Instructions (Signed)
Take OTC claritin 10mg  1 a day Use meds as prescribed If no better or if sx resurface : call me or see your dermatologist

## 2011-06-12 ENCOUNTER — Telehealth: Payer: Self-pay | Admitting: Internal Medicine

## 2011-06-12 DIAGNOSIS — T7840XA Allergy, unspecified, initial encounter: Secondary | ICD-10-CM

## 2011-06-12 NOTE — Telephone Encounter (Signed)
Patient was seen hives about a month ago & was told if they came back he would be referred to derm - hives are back worse then before

## 2011-06-12 NOTE — Telephone Encounter (Signed)
Continue using betamethasone cream, Claritin, call if sx severe, will need steroids. Also I changed the referral from dermatology to allergy.

## 2011-06-12 NOTE — Telephone Encounter (Signed)
Referral put in left Pt detail message with this info. Please advise on any further treatment while awaiting appt.

## 2011-06-13 NOTE — Telephone Encounter (Signed)
Left message to call office

## 2011-06-14 ENCOUNTER — Telehealth: Payer: Self-pay | Admitting: Internal Medicine

## 2011-06-14 NOTE — Telephone Encounter (Signed)
He can use claritin 10 mg 1 a day or zyrtec 10 mg 1 a day (not both)

## 2011-06-14 NOTE — Telephone Encounter (Signed)
Discuss with patient  

## 2011-06-14 NOTE — Telephone Encounter (Signed)
In reference to Allergy referral to Dr. Maple Hudson, his schedule is booked into late January.  Patient requested another practice.  Patient appointment is 06-22-11 with Dr. Irena Cords of [facility].  In the meantime the patient states he is still broke out and wants to know if there is another medication other than Benadryl to take that will not make him sleepy.  Please advise.

## 2011-06-15 NOTE — Telephone Encounter (Signed)
Left message to call office

## 2011-06-18 NOTE — Telephone Encounter (Signed)
Discuss with patient  

## 2011-07-17 ENCOUNTER — Institutional Professional Consult (permissible substitution): Payer: BC Managed Care – PPO | Admitting: Internal Medicine

## 2012-09-16 ENCOUNTER — Encounter: Payer: Self-pay | Admitting: Internal Medicine

## 2012-09-16 ENCOUNTER — Ambulatory Visit (INDEPENDENT_AMBULATORY_CARE_PROVIDER_SITE_OTHER): Payer: BC Managed Care – PPO | Admitting: Internal Medicine

## 2012-09-16 VITALS — BP 126/78 | HR 69 | Temp 98.2°F | Wt 182.0 lb

## 2012-09-16 DIAGNOSIS — L0293 Carbuncle, unspecified: Secondary | ICD-10-CM

## 2012-09-16 DIAGNOSIS — L0292 Furuncle, unspecified: Secondary | ICD-10-CM

## 2012-09-16 MED ORDER — DOXYCYCLINE HYCLATE 100 MG PO TABS: 100.0000 mg | ORAL_TABLET | Freq: Two times a day (BID) | ORAL | Status: DC

## 2012-09-16 NOTE — Progress Notes (Signed)
  Subjective:    Patient ID: Christopher Moses, male    DOB: September 29, 1955, 58 y.o.   MRN: [id number]  HPI Acute visit About a week ago, not that a small swelling area add see right neck, area is getting tender and beer particularly in the last day. He thinks he has a boil.  Past Medical History  Diagnosis Date  . ADHD (attention deficit hyperactivity disorder)     By psych 09/2009   Past Surgical History  Procedure Laterality Date  . Appendectomy    . Knee arthroscopy      Left      Review of Systems No fever chills No discharge     Objective:   Physical Exam  Constitutional: He appears well-developed.  HENT:  Head: Normocephalic and atraumatic.  Neck: Normal range of motion. Neck supple.        Assessment & Plan:   Superficial neck boil. We discussed possibly antibiotics versus antibiotics + I&D, he elected to go ahead and do a decision.  Procedure note. In a sterile fashion  and under local anesthesia with lidocaine 2% w/o approximately half cc, I did a incision of about 0.5 cm, obtaining about 2 cc of purulent discharge. No sebaceous material noted. The patient tolerated the procedure well. Minimal bleeding. See instructions. Culture sent  Today , I spent more than 25 min with the patient, >50% of the time counseling, and draining his boil

## 2012-09-16 NOTE — Patient Instructions (Signed)
Keep the area clean and dry. Okay to take a shower today Keep the area cover with gauze and tape If bleeding, apply pressure for at least 10 minutes. Take antibiotics for one week. Call anytime if you have severe bleeding, the area gets more swollen, you have fever chills or increased redness in the neck.

## 2012-09-19 LAB — WOUND CULTURE: Gram Stain: NONE SEEN

## 2013-06-02 ENCOUNTER — Encounter: Payer: Self-pay | Admitting: Internal Medicine

## 2013-06-02 ENCOUNTER — Ambulatory Visit (INDEPENDENT_AMBULATORY_CARE_PROVIDER_SITE_OTHER): Payer: BC Managed Care – PPO | Admitting: Internal Medicine

## 2013-06-02 VITALS — BP 105/65 | HR 77 | Temp 97.7°F | Wt 179.0 lb

## 2013-06-02 DIAGNOSIS — L500 Allergic urticaria: Secondary | ICD-10-CM

## 2013-06-02 MED ORDER — PREDNISONE 10 MG PO TABS: ORAL_TABLET | ORAL | Status: DC

## 2013-06-02 NOTE — Patient Instructions (Signed)
Zyrtec 10 mg OTC one tablet every day for 2 weeks Zantac 75 mg OTC one tablet twice a day for 2 weeks Take prednisone as prescribed If not gradually improving or if the symptoms came back please call for a referral to an allergist. Call anytime if you have severe symptoms, lip or tongue swelling.  Due for a physical exam, schedule at your convenience

## 2013-06-02 NOTE — Progress Notes (Signed)
Pre visit review using our clinic review tool, if applicable. No additional management support is needed unless otherwise documented below in the visit note. 

## 2013-06-02 NOTE — Progress Notes (Signed)
   Subjective:    Patient ID: Christopher Moses, male    DOB: [birth year], 57 y.o.   MRN: [id number]  HPI Acute visit. Symptoms started yesterday with whelps wherever he applies pressure, for instance  when she scratches or put pressure on the shoulders carrying a backpack, symptoms are limited to the thorax up. He is taking Benadryl with some relief. On looking back the patient had scattered similar symptoms over the past 2 weeks.   Past Medical History  Diagnosis Date  . ADHD (attention deficit hyperactivity disorder)     By psych 09/2009   Past Surgical History  Procedure Laterality Date  . Appendectomy    . Knee arthroscopy      Left    History   Social History  . Marital Status: Married    Spouse Name: N/A    Number of Children: 4  . Years of Education: N/A   Occupational History  . own a coating Co    Social History Main Topics  . Smoking status: Current Every Day Smoker -- 1.00 packs/day  . Smokeless tobacco: Never Used     Comment: 1 ppd  . Alcohol Use: Yes     Comment: Social  . Drug Use: No  . Sexual Activity: Not on file   Other Topics Concern  . Not on file   Social History Narrative   Lives w/ wife      Review of Systems No fever or chills No new exposures to any substances, pets ,food, chemicals. 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 Denies F - C, arthralgias  No nausea, vomiting, diarrhea. No cough, wheezing, cough and mild swelling.     Objective:   Physical Exam BP 105/65  Pulse 77  Temp(Src) 97.7 F (36.5 C)  Wt 179 lb (81.194 kg)  SpO2 100% General -- alert, well-developed, NAD.  Neck --no thyromegaly Lungs -- normal respiratory effort, no intercostal retractions, no accessory muscle use, and normal breath sounds.  Heart-- normal rate, regular rhythm, no murmur.  Skin: Several papular, slightly red skin lesions, mostly round but  few are annular, not linear, located from the thorax up Extremities-- no pretibial  edema bilaterally  Neurologic--  alert & oriented X3.  Psych-- Cognition and judgment appear intact. Cooperative with normal attention span and concentration. No anxious appearing , no depressed appearing.      Assessment & Plan:  Rash, urticaria. Patient presents with rash, some of the lesions are annular. No  new exposures, no systemic symptoms   Plan:  antihistaminics, short dose prednisone and referral to allergy if needed particulalrly if annular lesions continue ; also may need further eval including a CXR, labs  See instructions. Also patient is overdue for her physical, recommend to schedule that.

## 2013-06-15 ENCOUNTER — Telehealth: Payer: Self-pay

## 2013-06-15 NOTE — Telephone Encounter (Signed)
Medication List and allergies:  Reviewed and updated  90 day supply/mail order: na Local prescriptions: [facility] [address]  Immunizations due: Declines flu vaccine  A/P:   No changes to FH or PSH or personal Hx Tdap--08/2006 CCS--never had one PSA--09/2009--0.38  To Discuss with Provider: Not at this time

## 2013-06-16 ENCOUNTER — Encounter: Payer: Self-pay | Admitting: Internal Medicine

## 2013-06-16 ENCOUNTER — Ambulatory Visit (INDEPENDENT_AMBULATORY_CARE_PROVIDER_SITE_OTHER): Payer: BC Managed Care – PPO | Admitting: Internal Medicine

## 2013-06-16 VITALS — BP 123/71 | HR 81 | Temp 97.9°F | Ht 68.0 in | Wt 180.0 lb

## 2013-06-16 DIAGNOSIS — Z0000 Encounter for general adult medical examination without abnormal findings: Secondary | ICD-10-CM

## 2013-06-16 DIAGNOSIS — L500 Allergic urticaria: Secondary | ICD-10-CM

## 2013-06-16 DIAGNOSIS — E7800 Pure hypercholesterolemia, unspecified: Secondary | ICD-10-CM

## 2013-06-16 DIAGNOSIS — F909 Attention-deficit hyperactivity disorder, unspecified type: Secondary | ICD-10-CM

## 2013-06-16 NOTE — Assessment & Plan Note (Signed)
Td 2008 Declined flu shots, benefits discussed zostavax -- discussed  Never had a cscope, pro-cons discussed, refer to GI Sees urology q 6 months, had a DRE 6 weeks ago. Labs  Discussed diet-exercise- Tobacco risk and cessation (chantix-patches), does see a dentist regularly

## 2013-06-16 NOTE — Assessment & Plan Note (Signed)
Currently doing well without any medication

## 2013-06-16 NOTE — Assessment & Plan Note (Signed)
See last office visit, was diagnosed with urticaria, responded very well and quickly to treatment, yesterday however he had mild symptoms again, he took OTCs antihistamine it and is a slightly better today. Plan: Observation for now, if symptoms resurface or become an on-off problem he will let me know, will refer to an allergist

## 2013-06-16 NOTE — Progress Notes (Signed)
Pre visit review using our clinic review tool, if applicable. No additional management support is needed unless otherwise documented below in the visit note. 

## 2013-06-16 NOTE — Patient Instructions (Signed)
Come back fasting for labs only:  FLP, CMP, CBC, TSH, PSA--- dx v70  Next visit for a physical exam   fasting, in 1 year Please make an appointment

## 2013-06-16 NOTE — Progress Notes (Signed)
   Subjective:    Patient ID: Christopher Moses, male    DOB: [birth year], 57 y.o.   MRN: [id number]  HPI CPX  Past Medical History  Diagnosis Date  . ADHD (attention deficit hyperactivity disorder)     By psych 09/2009   Past Surgical History  Procedure Laterality Date  . Appendectomy    . Knee arthroscopy      Left   . Vasectomy  ~2012   History   Social History  . Marital Status: Married    Spouse Name: N/A    Number of Children: 4  . Years of Education: N/A   Occupational History  . own a coating [employer]    Social History Main Topics  . Smoking status: Current Every Day Smoker -- 1.00 packs/day  . Smokeless tobacco: Never Used     Comment: 1 ppd   . Alcohol Use: Yes     Comment: Social  . Drug Use: No  . Sexual Activity: Not on file   Other Topics Concern  . Not on file   Social History Narrative   Lives w/ wife   Family History  Problem Relation Age of Onset  . Heart attack Father     MI @ 51 also  . Prostate cancer Other 64    Uncle  . Colon cancer Neg Hx   . Diabetes Neg Hx   . Hypertension Neg Hx     Review of Systems Diet-- usually ok Exercise-- active at work, no routine exercise  No  CP, SOB, lower extremity edema Denies  nausea, vomiting diarrhea Denies  blood in the stools  (-) cough, sputum production, (-) wheezing, chest congestion (-)hemoptysis No dysuria, gross hematuria, difficulty urinating   (-) Testicular swelling-pain        Objective:   Physical Exam BP 123/71  Pulse 81  Temp(Src) 97.9 F (36.6 C)  Ht 5\' 8"  (1.727 m)  Wt 180 lb (81.647 kg)  BMI 27.38 kg/m2  SpO2 98% General -- alert, well-developed, NAD.  Neck --no thyromegaly ,  no LAD HEENT-- Not pale.  Lungs -- normal respiratory effort, no intercostal retractions, no accessory muscle use, and normal breath sounds.  Heart-- normal rate, regular rhythm, no murmur.  Abdomen-- Not distended, good bowel sounds,soft, non-tender.  extremities-- no pretibial edema  bilaterally  Neurologic--  alert & oriented X3. Speech normal, gait normal, strength normal in all extremities.  Psych-- Cognition and judgment appear intact. Cooperative with normal attention span and concentration. No anxious appearing , no depressed appearing.      Assessment & Plan:

## 2013-06-17 ENCOUNTER — Other Ambulatory Visit (INDEPENDENT_AMBULATORY_CARE_PROVIDER_SITE_OTHER): Payer: BC Managed Care – PPO

## 2013-06-17 DIAGNOSIS — Z0000 Encounter for general adult medical examination without abnormal findings: Secondary | ICD-10-CM

## 2013-06-17 LAB — CBC WITH DIFFERENTIAL/PLATELET
Basophils Absolute: 0 10*3/uL (ref 0.0–0.1)
Eosinophils Absolute: 0.2 10*3/uL (ref 0.0–0.7)
Hemoglobin: 14.8 g/dL (ref 13.0–17.0)
Lymphocytes Relative: 17.7 % (ref 12.0–46.0)
MCHC: 33 g/dL (ref 30.0–36.0)
Monocytes Relative: 7.5 % (ref 3.0–12.0)
Neutrophils Relative %: 72.6 % (ref 43.0–77.0)
Platelets: 344 10*3/uL (ref 150.0–400.0)
RBC: 4.95 Mil/uL (ref 4.22–5.81)
RDW: 14.9 % — ABNORMAL HIGH (ref 11.5–14.6)

## 2013-06-17 LAB — LDL CHOLESTEROL, DIRECT: Direct LDL: 165.3 mg/dL

## 2013-06-17 LAB — LIPID PANEL
Cholesterol: 219 mg/dL — ABNORMAL HIGH (ref 0–200)
HDL: 40.5 mg/dL (ref >39.00)

## 2013-06-17 LAB — COMPREHENSIVE METABOLIC PANEL
AST: 27 U/L (ref 0–37)
Albumin: 4.4 g/dL (ref 3.5–5.2)
Alkaline Phosphatase: 62 U/L (ref 39–117)
BUN: 11 mg/dL (ref 6–23)
CO2: 28 mEq/L (ref 19–32)
Creatinine, Ser: 0.8 mg/dL (ref 0.4–1.5)
GFR: 112.83 mL/min (ref >60.00)
Total Protein: 6.8 g/dL (ref 6.0–8.3)

## 2013-06-17 LAB — PSA: PSA: 0.47 ng/mL (ref 0.10–4.00)

## 2013-06-18 ENCOUNTER — Encounter: Payer: Self-pay | Admitting: Gastroenterology

## 2013-06-22 MED ORDER — ATORVASTATIN CALCIUM 20 MG PO TABS: 20.0000 mg | ORAL_TABLET | Freq: Every day | ORAL | Status: DC

## 2013-06-22 NOTE — Addendum Note (Signed)
Addended by: Eustace Quail on: 06/22/2013 10:49 AM   Modules accepted: Orders

## 2013-07-01 ENCOUNTER — Telehealth: Payer: Self-pay | Admitting: Internal Medicine

## 2013-07-01 NOTE — Telephone Encounter (Signed)
Patient wants to let Dr. Drue Novel know that his allergy issues have returned and that he would like a referral to see a specialist.

## 2013-07-10 ENCOUNTER — Telehealth: Payer: Self-pay | Admitting: *Deleted

## 2013-07-10 DIAGNOSIS — L500 Allergic urticaria: Secondary | ICD-10-CM

## 2013-07-10 NOTE — Telephone Encounter (Signed)
Patient called and requested a referral to an allergist.

## 2013-07-10 NOTE — Addendum Note (Signed)
Addended by: Kathlene November E on: 07/10/2013 04:48 PM   Modules accepted: Orders

## 2013-07-10 NOTE — Telephone Encounter (Signed)
Referral entered  

## 2013-08-03 ENCOUNTER — Other Ambulatory Visit: Payer: BC Managed Care – PPO

## 2013-08-06 ENCOUNTER — Encounter: Payer: BC Managed Care – PPO | Admitting: Gastroenterology

## 2013-08-17 ENCOUNTER — Other Ambulatory Visit: Payer: BC Managed Care – PPO

## 2013-08-24 ENCOUNTER — Ambulatory Visit (AMBULATORY_SURGERY_CENTER): Payer: Self-pay | Admitting: *Deleted

## 2013-08-24 VITALS — Ht 68.0 in | Wt 171.6 lb

## 2013-08-24 DIAGNOSIS — Z1211 Encounter for screening for malignant neoplasm of colon: Secondary | ICD-10-CM

## 2013-08-24 MED ORDER — MOVIPREP 100 G PO SOLR: 1.0000 | Freq: Once | ORAL | Status: DC

## 2013-08-24 NOTE — Progress Notes (Signed)
No egg or soy allergy. ewm No home 02 or cpap use. ewm No problems with past sedation. ewm Pt declined emmi. ewm

## 2013-08-25 ENCOUNTER — Encounter: Payer: Self-pay | Admitting: Gastroenterology

## 2013-09-07 ENCOUNTER — Ambulatory Visit (AMBULATORY_SURGERY_CENTER): Payer: BC Managed Care – PPO | Admitting: Gastroenterology

## 2013-09-07 ENCOUNTER — Encounter: Payer: Self-pay | Admitting: Gastroenterology

## 2013-09-07 VITALS — BP 111/66 | HR 64 | Temp 97.3°F | Resp 11 | Ht 68.0 in | Wt 171.0 lb

## 2013-09-07 DIAGNOSIS — D126 Benign neoplasm of colon, unspecified: Secondary | ICD-10-CM

## 2013-09-07 DIAGNOSIS — Z1211 Encounter for screening for malignant neoplasm of colon: Secondary | ICD-10-CM

## 2013-09-07 DIAGNOSIS — K648 Other hemorrhoids: Secondary | ICD-10-CM

## 2013-09-07 MED ORDER — SODIUM CHLORIDE 0.9 % IV SOLN: 500.0000 mL | INTRAVENOUS | Status: DC

## 2013-09-07 NOTE — Progress Notes (Signed)
No complaints noted in the recovery room. Maw   

## 2013-09-07 NOTE — Progress Notes (Signed)
Called to room to assist during endoscopic procedure.  Patient ID and intended procedure confirmed with present staff. Received instructions for my participation in the procedure from the performing physician.  

## 2013-09-07 NOTE — Op Note (Signed)
[place]  [address] [place], [id number]   COLONOSCOPY PROCEDURE REPORT  PATIENT: Christopher Moses, Christopher Moses  MR#: [id number] BIRTHDATE: [birth year] , 58  yrs. old GENDER: Male ENDOSCOPIST: Inda Castle, MD REFERRED AO:ZHYQ Larose Kells, M.D. PROCEDURE DATE:  09/07/2013 PROCEDURE:   Colonoscopy with snare polypectomy and Colonoscopy with cold biopsy polypectomy First Screening Colonoscopy - Avg.  risk and is 50 yrs.  old or older Yes.  Prior Negative Screening - Now for repeat screening. N/A  History of Adenoma - Now for follow-up colonoscopy & has been > or = to 3 yrs.  N/A  Polyps Removed Today? Yes. ASA CLASS:   Class I INDICATIONS:average risk screening. MEDICATIONS: MAC sedation, administered by CRNA and Propofol (Diprivan) 230 mg IV  DESCRIPTION OF PROCEDURE:   After the risks benefits and alternatives of the procedure were thoroughly explained, informed consent was obtained.  A digital rectal exam revealed no abnormalities of the rectum.   The LB [id number]  endoscope was introduced through the anus and advanced to the cecum, which was identified by both the appendix and ileocecal valve. No adverse events experienced.   The quality of the prep was excellent using Suprep  The instrument was then slowly withdrawn as the colon was fully examined.      COLON FINDINGS: A sessile polyp measuring 3 mm in size was found in the descending colon.  A polypectomy was performed with a cold snare.  The resection was complete and the polyp tissue was completely retrieved.   A flat polyp measuring 2 mm in size was found in the sigmoid colon.  A polypectomy was performed with cold forceps.   Internal hemorrhoids were found.   The colon was otherwise normal.  There was no diverticulosis, inflammation, polyps or cancers unless previously stated.  Retroflexed views revealed no abnormalities. The time to cecum=2 minutes 26 seconds. Withdrawal time=13 minutes 57 seconds.  The  scope was withdrawn and the procedure completed. COMPLICATIONS: There were no complications.  ENDOSCOPIC IMPRESSION: 1.   Sessile polyp measuring 3 mm in size was found in the descending colon; polypectomy was performed with a cold snare 2.   Flat polyp measuring 2 mm in size was found in the sigmoid colon; polypectomy was performed with cold forceps 3.   Internal hemorrhoids 4.   The colon was otherwise normal  RECOMMENDATIONS: If the polyp(s) removed today are proven to be adenomatous (pre-cancerous) polyps, you will need a repeat colonoscopy in 5 years.  Otherwise you should continue to follow colorectal cancer screening guidelines for "routine risk" patients with colonoscopy in 10 years.  You will receive a letter within 1-2 weeks with the results of your biopsy as well as final recommendations.  Please call my office if you have not received a letter after 3 weeks.   eSigned:  Inda Castle, MD 09/07/2013 11:08 AM   cc:   PATIENT NAME:  Christopher Moses, Christopher Moses MR#: [id number]

## 2013-09-07 NOTE — Progress Notes (Signed)
Procedure ends, to recovery, report given and VSS. 

## 2013-09-07 NOTE — Patient Instructions (Signed)
YOU HAD AN ENDOSCOPIC PROCEDURE TODAY AT THE [facility]: Refer to the procedure report that was given to you for any specific questions about what was found during the examination.  If the procedure report does not answer your questions, please call your gastroenterologist to clarify.  If you requested that your care partner not be given the details of your procedure findings, then the procedure report has been included in a sealed envelope for you to review at your convenience later.  YOU SHOULD EXPECT: Some feelings of bloating in the abdomen. Passage of more gas than usual.  Walking can help get rid of the air that was put into your GI tract during the procedure and reduce the bloating. If you had a lower endoscopy (such as a colonoscopy or flexible sigmoidoscopy) you may notice spotting of blood in your stool or on the toilet paper. If you underwent a bowel prep for your procedure, then you may not have a normal bowel movement for a few days.  DIET: Your first meal following the procedure should be a light meal and then it is ok to progress to your normal diet.  A half-sandwich or bowl of soup is an example of a good first meal.  Heavy or fried foods are harder to digest and may make you feel nauseous or bloated.  Likewise meals heavy in dairy and vegetables can cause extra gas to form and this can also increase the bloating.  Drink plenty of fluids but you should avoid alcoholic beverages for 24 hours.  ACTIVITY: Your care partner should take you home directly after the procedure.  You should plan to take it easy, moving slowly for the rest of the day.  You can resume normal activity the day after the procedure however you should NOT DRIVE or use heavy machinery for 24 hours (because of the sedation medicines used during the test).    SYMPTOMS TO REPORT IMMEDIATELY: A gastroenterologist can be reached at any hour.  During normal business hours, 8:30 AM to 5:00 PM Monday through Friday,  call [phone].  After hours and on weekends, please call the GI answering service at [phone] who will take a message and have the physician on call contact you.   Following lower endoscopy (colonoscopy or flexible sigmoidoscopy):  Excessive amounts of blood in the stool  Significant tenderness or worsening of abdominal pains  Swelling of the abdomen that is new, acute  Fever of 100F or higher   FOLLOW UP: If any biopsies were taken you will be contacted by phone or by letter within the next 1-3 weeks.  Call your gastroenterologist if you have not heard about the biopsies in 3 weeks.  Our staff will call the home number listed on your records the next business day following your procedure to check on you and address any questions or concerns that you may have at that time regarding the information given to you following your procedure. This is a courtesy call and so if there is no answer at the home number and we have not heard from you through the emergency physician on call, we will assume that you have returned to your regular daily activities without incident.  SIGNATURES/CONFIDENTIALITY: You and/or your care partner have signed paperwork which will be entered into your electronic medical record.  These signatures attest to the fact that that the information above on your After Visit Summary has been reviewed and is understood.  Full responsibility of the confidentiality of   this discharge information lies with you and/or your care-partner.    Handouts were given to your care partner on polyps, hemorrhoids and a high fiber diet with liberal fluid intake. You may resume your current medications today. Please call if any questions or concerns.

## 2013-09-08 ENCOUNTER — Telehealth: Payer: Self-pay | Admitting: *Deleted

## 2013-09-08 NOTE — Telephone Encounter (Signed)
No answer or answering machine. Unable to leave ,message for the patient.

## 2013-09-11 ENCOUNTER — Encounter: Payer: Self-pay | Admitting: Gastroenterology

## 2013-09-15 ENCOUNTER — Encounter: Payer: Self-pay | Admitting: *Deleted

## 2015-07-03 DIAGNOSIS — L089 Local infection of the skin and subcutaneous tissue, unspecified: Secondary | ICD-10-CM

## 2015-07-03 HISTORY — DX: Local infection of the skin and subcutaneous tissue, unspecified: L08.9

## 2016-03-27 ENCOUNTER — Ambulatory Visit (INDEPENDENT_AMBULATORY_CARE_PROVIDER_SITE_OTHER): Payer: Self-pay | Admitting: Medical

## 2016-03-27 ENCOUNTER — Encounter: Payer: Self-pay | Admitting: Medical

## 2016-03-27 VITALS — HR 66 | Temp 97.9°F | Ht 68.0 in | Wt 185.8 lb

## 2016-03-27 DIAGNOSIS — R591 Generalized enlarged lymph nodes: Secondary | ICD-10-CM | POA: Diagnosis not present

## 2016-03-27 LAB — CBC WITH DIFFERENTIAL/PLATELET
BASOS PCT: 0.8 % (ref 0.0–3.0)
Basophils Absolute: 0.1 10*3/uL (ref 0.0–0.1)
EOS PCT: 3.2 % (ref 0.0–5.0)
Eosinophils Absolute: 0.3 10*3/uL (ref 0.0–0.7)
HCT: 40.5 % (ref 39.0–52.0)
Hemoglobin: 13.5 g/dL (ref 13.0–17.0)
Lymphocytes Relative: 20.7 % (ref 12.0–46.0)
Lymphs Abs: 1.6 10*3/uL (ref 0.7–4.0)
MCHC: 33.4 g/dL (ref 30.0–36.0)
MCV: 90.3 fl (ref 78.0–100.0)
MONOS PCT: 7.5 % (ref 3.0–12.0)
Monocytes Absolute: 0.6 10*3/uL (ref 0.1–1.0)
Neutro Abs: 5.3 10*3/uL (ref 1.4–7.7)
Neutrophils Relative %: 67.8 % (ref 43.0–77.0)
Platelets: 338 10*3/uL (ref 150.0–400.0)
RBC: 4.48 Mil/uL (ref 4.22–5.81)
RDW: 14.4 % (ref 11.5–15.5)
WBC: 7.9 10*3/uL (ref 4.0–10.5)

## 2016-03-27 MED ORDER — AMOXICILLIN-POT CLAVULANATE 875-125 MG PO TABS: 1.0000 | ORAL_TABLET | Freq: Two times a day (BID) | ORAL | 0 refills | Status: DC

## 2016-03-27 NOTE — Progress Notes (Signed)
Pre visit review using our clinic tool,if applicable. No additional management support is needed unless otherwise documented below in the visit note.  

## 2016-03-27 NOTE — Progress Notes (Signed)
Subjective:    Patient ID: Christopher Moses, male    DOB: [birth year], 60 y.o.   MRN: [id number]  HPI  Pt in with some swollen gland under  rt lower jaw region. Started on Friday. Gradually getting larger.  Pt states area feels sore. But not tender. No fevers, no chills or sweats.   Pt never had swelling in this area before. No teeth pain. No ear pain.  Some dental implants on bottom but jaw line does not hurt.  No skin rash.    Review of Systems  Constitutional: Negative for chills, fatigue and fever.  HENT: Negative for congestion, ear discharge, ear pain, mouth sores, nosebleeds, postnasal drip, rhinorrhea, sinus pressure, sneezing, sore throat, trouble swallowing and voice change.   Respiratory: Negative for cough, chest tightness, shortness of breath and wheezing.   Cardiovascular: Negative for chest pain and palpitations.  Gastrointestinal: Negative for abdominal pain.  Musculoskeletal: Negative for back pain.  Hematological: Positive for adenopathy.  Psychiatric/Behavioral: Negative for confusion.   Past Medical History:  Diagnosis Date  . ADHD (attention deficit hyperactivity disorder)    By psych 09/2009     Social History   Social History  . Marital status: Married    Spouse name: N/A  . Number of children: 4  . 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 Smoking status: Current Every Day Smoker    Packs/day: 1.00  . Smokeless tobacco: Never Used     Comment: 1 ppd   . Alcohol use 3.0 oz/week    5 Glasses of wine per week     Comment: Social  . Drug use: No  . Sexual activity: Not on file   Other Topics Concern  . Not on file   Social History Narrative   Lives w/ wife    Past Surgical History:  Procedure Laterality Date  . APPENDECTOMY    . KNEE ARTHROSCOPY     Left   . VASECTOMY  ~2012    Family History  Problem Relation Age of Onset  . Heart attack Father     MI @ 67 also  .  Prostate cancer Other 64    Uncle  . Colon cancer Neg Hx   . Diabetes Neg Hx   . Hypertension Neg Hx   . Rectal cancer Neg Hx   . Stomach cancer Neg Hx     Allergies  Allergen Reactions  . Codeine Sulfate     REACTION: hives    Current Outpatient Prescriptions on File Prior to Visit  Medication Sig Dispense Refill  . atorvastatin (LIPITOR) 20 MG tablet Take 1 tablet (20 mg total) by mouth daily. (Patient not taking: Reported on 03/27/2016) 30 tablet 1   No current facility-administered medications on file prior to visit.     Pulse 66   Temp 97.9 F (36.6 C) (Oral)   Ht 5\' 8"  (1.727 m)   Wt 185 lb 12.8 oz (84.3 kg)   SpO2 98%   BMI 28.25 kg/m       Objective:   Physical Exam  General  Mental Status - Alert. General Appearance - Well groomed. Not in acute distress.  Skin Rashes- No Rashes.  HEENT Head- Normal. Ear Auditory Canal - Left- Normal. Right - Normal.Tympanic Membrane- Left- Normal. Right- Normal. Eye Sclera/Conjunctiva- Left- Normal. Right- Normal. Nose & Sinuses Nasal Mucosa- Left-  Boggy and Congested. Right-  Boggy and  Congested.Bilateral no maxillary and  No frontal sinus pressure. Mouth & Throat Lips: Upper Lip- Normal: no dryness, cracking, pallor, cyanosis, or vesicular eruption. Lower Lip-Normal: no dryness, cracking, pallor, cyanosis or vesicular eruption. Buccal Mucosa- Bilateral- No Aphthous ulcers. Oropharynx- No Discharge or Erythema. Tonsils: Characteristics- Bilateral- No Erythema or Congestion. Size/Enlargement- Bilateral- No enlargement. Discharge- bilateral-None.  Rt mandible- no tenderness to palpation of jaw line. No obvious enlared parotid gland. Minimal at best.  Neck Neck- Supple. No Masses. See lymph node exam.   Chest and Lung Exam Auscultation: Breath Sounds:-Clear even and unlabored.  Cardiovascular Auscultation:Rythm- Regular, rate and rhythm. Murmurs & Other Heart Sounds:Ausculatation of the heart reveal- No  Murmurs.  Lymphatic Head & Neck General Head & Neck Lymphatics: Bilateral: Description- No Localized lymphadenopathy. Moderate enlarged rt submandibular node on palpation.         Assessment & Plan:  For your enlarged lymph node will get cbc today to assess infection fighting cells. Will go ahead and rx augmentin antibiotic.  On review of area no definite regional source of infection found on exam. Will rx augmentin antibiotic and see if this impacts size of note.   No definite swelling of parotid gland but occasional hard candy to promote salivation may be helping pending full work up.  If blood work normal but area remains the same size the consider [place] of neck/area in question with referral to specialist.  Recheck area in 6 days or as needed.(in new symptoms or signs during interim let us know.)  Explained the importance of follow up.  Deshayla Empson, Percell Miller, PA-C

## 2016-03-27 NOTE — Patient Instructions (Addendum)
For your enlarged lymph node will get cbc today to assess infection fighting cells. Will go ahead and rx augmentin antibiotic.  On review of area no definite regional source of infection found on exam. Will rx augmentin antibiotic and see if this impacts size of note.   No definite swelling of parotid gland but occasional hard candy to promote salivation may be helping pending full work up.  If blood work normal but area remains the same size the consider [place] of neck/area in question with referral to specialist.  Recheck area in 6 days or as needed.(in new symptoms or signs during interim let us know.

## 2016-04-02 ENCOUNTER — Telehealth: Payer: Self-pay | Admitting: Medical

## 2016-04-02 ENCOUNTER — Ambulatory Visit (HOSPITAL_BASED_OUTPATIENT_CLINIC_OR_DEPARTMENT_OTHER)
Admission: RE | Admit: 2016-04-02 | Discharge: 2016-04-02 | Disposition: A | Discharge status: Home / Self Care | Payer: BLUE CROSS/BLUE SHIELD | Source: Ambulatory Visit | Attending: Medical | Admitting: Medical

## 2016-04-02 ENCOUNTER — Encounter: Payer: Self-pay | Admitting: Medical

## 2016-04-02 ENCOUNTER — Ambulatory Visit (INDEPENDENT_AMBULATORY_CARE_PROVIDER_SITE_OTHER): Payer: BLUE CROSS/BLUE SHIELD | Admitting: Medical

## 2016-04-02 VITALS — BP 122/80 | HR 68 | Temp 98.2°F | Ht 68.0 in | Wt 185.6 lb

## 2016-04-02 DIAGNOSIS — R591 Generalized enlarged lymph nodes: Secondary | ICD-10-CM | POA: Insufficient documentation

## 2016-04-02 DIAGNOSIS — R221 Localized swelling, mass and lump, neck: Secondary | ICD-10-CM

## 2016-04-02 MED ORDER — DICLOFENAC SODIUM 75 MG PO TBEC: 75.0000 mg | DELAYED_RELEASE_TABLET | Freq: Two times a day (BID) | ORAL | Status: DC

## 2016-04-02 MED ORDER — CEFTRIAXONE SODIUM 1 G IJ SOLR
1.0000 g | Freq: Once | INTRAMUSCULAR | Status: AC
  Administered 2016-04-02: 1 g via INTRAMUSCULAR

## 2016-04-02 MED ORDER — HYDROCODONE-ACETAMINOPHEN 5-325 MG PO TABS: 1.0000 | ORAL_TABLET | Freq: Four times a day (QID) | ORAL | 0 refills | Status: DC | PRN

## 2016-04-02 NOTE — Progress Notes (Signed)
Subjective:    Patient ID: Christopher Moses, male    DOB: [birth year], 58 y.o.   MRN: [id number]  HPI   Pt in for follow up. Pt states over the weekend his rt jaw area and neck is stilling is more swollen than before.Marland Kitchen Pt cbc was normal. No fevers, no chills or sweats.   Pt when he turns his neck rt submandibular area does hurt.   Pt is about to head out of town today.  Pt is on Augmentin for past 6 days.  Pt pain level is about 3/10.  No tooth pain.  Pt had tooth extracted back rt side lower tooth years ago Other tooth extraction years ago. He has been on pain med periodically when had extraction just can't remember what was given. But no reaction.    Review of Systems  Constitutional: Negative for chills, fatigue and fever.  HENT: Negative for congestion, ear discharge, ear pain, sinus pressure and sneezing.   Respiratory: Negative for cough, chest tightness, shortness of breath and wheezing.   Cardiovascular: Negative for chest pain and palpitations.  Gastrointestinal: Negative for abdominal pain.  Skin: Negative for rash.  Neurological: Negative for dizziness, seizures, speech difficulty, weakness and numbness.  Hematological: Positive for adenopathy. Does not bruise/bleed easily.  Psychiatric/Behavioral: Negative for behavioral problems and confusion.    Past Medical History:  Diagnosis Date  . ADHD (attention deficit hyperactivity disorder)    By psych 09/2009     Social History   Social History  . Marital status: Married    Spouse name: N/A  . Number of children: 4  . 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 Smoking status: Current Every Day Smoker    Packs/day: 1.00  . Smokeless tobacco: Never Used     Comment: 1 ppd   . Alcohol use 3.0 oz/week    5 Glasses of wine per week     Comment: Social  . Drug use: No  . Sexual activity: Not on file   Other Topics Concern  . Not on file   Social  History Narrative   Lives w/ wife    Past Surgical History:  Procedure Laterality Date  . APPENDECTOMY    . KNEE ARTHROSCOPY     Left   . VASECTOMY  ~2012    Family History  Problem Relation Age of Onset  . Heart attack Father     MI @ 70 also  . Prostate cancer Other 70    Uncle  . Colon cancer Neg Hx   . Diabetes Neg Hx   . Hypertension Neg Hx   . Rectal cancer Neg Hx   . Stomach cancer Neg Hx     Allergies  Allergen Reactions  . Codeine Sulfate     REACTION: hives    Current Outpatient Prescriptions on File Prior to Visit  Medication Sig Dispense Refill  . amoxicillin-clavulanate (AUGMENTIN) 875-125 MG tablet Take 1 tablet by mouth 2 (two) times daily. 20 tablet 0  . atorvastatin (LIPITOR) 20 MG tablet Take 1 tablet (20 mg total) by mouth daily. (Patient not taking: Reported on 04/02/2016) 30 tablet 1   No current facility-administered medications on file prior to visit.     BP 122/80   Pulse 68   Temp 98.2 F (36.8 C) (Oral)   Ht 5\' 8"  (1.727 m)   Wt 185 lb 9.6 oz (84.2 kg)  SpO2 98%   BMI 28.22 kg/m       Objective:   Physical Exam  General  Mental Status - Alert. General Appearance - Well groomed. Not in acute distress.  Skin Rashes- No Rashes.  HEENT Head- Normal. Ear Auditory Canal - Left- Normal. Right - Normal.Tympanic Membrane- Left- Normal. Right- Normal. Eye Sclera/Conjunctiva- Left- Normal. Right- Normal. Nose & Sinuses Nasal Mucosa- Left-  Boggy and Congested. Right-  Boggy and  Congested.Bilateral no  maxillary and no  frontal sinus pressure. Mouth & Throat Lips: Upper Lip- Normal: no dryness, cracking, pallor, cyanosis, or vesicular eruption. Lower Lip-Normal: no dryness, cracking, pallor, cyanosis or vesicular eruption. Buccal Mucosa- Bilateral- No Aphthous ulcers. Oropharynx- No Discharge or Erythema. Tonsils: Characteristics- Bilateral- No Erythema or Congestion. Size/Enlargement- Bilateral- No enlargement. Discharge-  bilateral-None. Mouth- lower jaw. Back molar not present. No pain on palpation below teeth.  Neck Neck- Supple. No Masses. Moderate increase in swelling rt submandibular area. Swelling extends over portion of mandible. Most of swelling below mandible. Good range of motion but turning head increases pain in this area.   Chest and Lung Exam Auscultation: Breath Sounds:-Clear even and unlabored.  Cardiovascular Auscultation:Rythm- Regular, rate and rhythm. Murmurs & Other Heart Sounds:Ausculatation of the heart reveal- No Murmurs.  Lymphatic Head & Neck General Head & Neck Lymphatics: Bilateral: Description- Left side submandibular area normal. Rt side swollen as seen on neck exam.       Assessment & Plan:  For your swollen lymph nodes vs mass will get [place] of area.   Will advise continue augmentin. We gave you rocephin 1 gram today.  For mild pain diclofenac. I want you to call your prior oral surgeon office and find out what they gave you for pain in the past since you had very strong allergy to codeine. Then notify us what they gave you.  If the area worsens while out of town then be seen while out of town.  Will go ahead and try to refer you to ENT by this Thursday.  If by tomorrow morning the area is more swollen despite the IM antibiotic then would recommend potentially canceling your work trip. I could at that point see if ENT could see you quicker.  Follow up with [place] Thursday or Friday if ENT appointment for some reason delayed.  Also considering having you see your dentist or oral surgeon if ENT does not find cause.  Wife calling oral surgeon office to find out what pain med has used in the past.  Pt confirmed prior use norco and no reaction.  Pt cell [phone]   Mackie Pai, PA-C

## 2016-04-02 NOTE — Telephone Encounter (Signed)
I notified [facility] of results and requested they see pt Thursday

## 2016-04-02 NOTE — Telephone Encounter (Signed)
Referral faxed today, will check status in morning if I have not heard from them

## 2016-04-02 NOTE — Progress Notes (Signed)
Pre visit review using our clinic tool,if applicable. No additional management support is needed unless otherwise documented below in the visit note.  

## 2016-04-02 NOTE — Telephone Encounter (Signed)
Both you and kristi area working on the referral to ENT. Can you get confirmation by Wednesday in am whether they can see him on Thursday. I need to know. If not I may need to arrange for him to get ct of neck by Thursday and see him either Thursday or Friday. But I need to know what direction we are headed by wed. Thanks. He is coming back from trip on Wednesday.

## 2016-04-02 NOTE — Patient Instructions (Addendum)
For your swollen lymph nodes vs mass will get [place] of area.   Will advise continue augmentin. We gave you rocephin 1 gram today.  For mild pain diclofenac. I want you to call your prior oral surgeon office and find out what they gave you for pain in the past since you had very strong allergy to codeine. Then notify us what they gave you.  If the area worsens while out of town then be seen while out of town.  Will go ahead and try to refer you to ENT by this Thursday.  If by tomorrow morning the area is more swollen despite the IM antibiotic then would recommend potentially canceling your work trip. I could at that point see if ENT could see you quicker.  Follow up with [place] Thursday or Friday if ENT appointment for some reason delayed.  Also considering having you see your dentist or oral surgeon if ENT does not find cause.

## 2016-04-02 NOTE — Telephone Encounter (Signed)
Talked with Dr. Larose Kells about pt presentation. He advised continue augmentin.   I called pt but no answer. Had to leave message(he is likley in on his flight presently).  Advised if he gets worse then be seen UC or ED. I will try to get him in with ENT on Thursday. He is coming back on wed afternoon.  I gave him by cell phone that he can call if he has questions about study.   I talked with Kristi(passed message to Edgar) in referral to see if they can get him in on this Thursday.

## 2016-04-03 ENCOUNTER — Telehealth: Payer: Self-pay | Admitting: Medical

## 2016-04-03 ENCOUNTER — Emergency Department (HOSPITAL_BASED_OUTPATIENT_CLINIC_OR_DEPARTMENT_OTHER)
Admission: EM | Admit: 2016-04-03 | Discharge: 2016-04-04 | Disposition: A | Discharge status: Home / Self Care | Payer: BLUE CROSS/BLUE SHIELD | Attending: Emergency Medicine | Admitting: Emergency Medicine

## 2016-04-03 ENCOUNTER — Encounter (HOSPITAL_BASED_OUTPATIENT_CLINIC_OR_DEPARTMENT_OTHER): Payer: Self-pay | Admitting: Emergency Medicine

## 2016-04-03 ENCOUNTER — Emergency Department (HOSPITAL_BASED_OUTPATIENT_CLINIC_OR_DEPARTMENT_OTHER): Payer: BLUE CROSS/BLUE SHIELD

## 2016-04-03 DIAGNOSIS — F909 Attention-deficit hyperactivity disorder, unspecified type: Secondary | ICD-10-CM | POA: Diagnosis not present

## 2016-04-03 DIAGNOSIS — F17200 Nicotine dependence, unspecified, uncomplicated: Secondary | ICD-10-CM | POA: Diagnosis not present

## 2016-04-03 DIAGNOSIS — R221 Localized swelling, mass and lump, neck: Secondary | ICD-10-CM | POA: Diagnosis not present

## 2016-04-03 DIAGNOSIS — Z79899 Other long term (current) drug therapy: Secondary | ICD-10-CM | POA: Insufficient documentation

## 2016-04-03 HISTORY — DX: Generalized enlarged lymph nodes: R59.1

## 2016-04-03 LAB — BASIC METABOLIC PANEL
ANION GAP: 9 (ref 5–15)
BUN: 13 mg/dL (ref 6–20)
CALCIUM: 8.7 mg/dL — AB (ref 8.9–10.3)
CHLORIDE: 107 mmol/L (ref 101–111)
CO2: 22 mmol/L (ref 22–32)
Creatinine, Ser: 0.69 mg/dL (ref 0.61–1.24)
GFR calc Af Amer: >60 mL/min (ref >60)
GFR calc non Af Amer: >60 mL/min (ref >60)
GLUCOSE: 102 mg/dL — AB (ref 65–99)
POTASSIUM: 3.7 mmol/L (ref 3.5–5.1)
Sodium: 138 mmol/L (ref 135–145)

## 2016-04-03 LAB — CBC WITH DIFFERENTIAL/PLATELET
BASOS ABS: 0 10*3/uL (ref 0.0–0.1)
Basophils Relative: 0 %
EOS PCT: 4 %
Eosinophils Absolute: 0.5 10*3/uL (ref 0.0–0.7)
HEMATOCRIT: 40.1 % (ref 39.0–52.0)
HEMOGLOBIN: 13.4 g/dL (ref 13.0–17.0)
LYMPHS PCT: 23 %
Lymphs Abs: 2.7 10*3/uL (ref 0.7–4.0)
MCH: 30 pg (ref 26.0–34.0)
MCHC: 33.4 g/dL (ref 30.0–36.0)
MCV: 89.9 fL (ref 78.0–100.0)
MONO ABS: 1.1 10*3/uL — AB (ref 0.1–1.0)
Monocytes Relative: 10 %
Neutro Abs: 7.5 10*3/uL (ref 1.7–7.7)
Neutrophils Relative %: 63 %
Platelets: 327 10*3/uL (ref 150–400)
RBC: 4.46 MIL/uL (ref 4.22–5.81)
RDW: 14.1 % (ref 11.5–15.5)
WBC: 11.8 10*3/uL — ABNORMAL HIGH (ref 4.0–10.5)

## 2016-04-03 MED ORDER — IOPAMIDOL (ISOVUE-300) INJECTION 61%
80.0000 mL | Freq: Once | INTRAVENOUS | Status: AC | PRN
  Administered 2016-04-03: 80 mL via INTRAVENOUS

## 2016-04-03 NOTE — ED Triage Notes (Signed)
Pt has a lump on the right side of his neck that began 10 days ago and has been growing.  Pt saw his PCP for it and has been on an antibiotic which has not helped.  Pt denies any SOB, difficulty swallowing or fever.  Pt has visible swelling to the right side of the neck with area the size of a golfball  Right under the back of the mandible on the right

## 2016-04-03 NOTE — ED Triage Notes (Signed)
Pt had [place] of neck yesterday and was advised to come to ED by PMD for CT with contrast of neck.

## 2016-04-03 NOTE — ED Provider Notes (Signed)
[facility] Provider Note   CSN: [id number] Arrival date & time: 04/03/16  2246 By signing my name below, I, Dyke Brackett, attest that this documentation has been prepared under the direction and in the presence of Daivon Rayos, MD . Electronically Signed: Dyke Brackett, Scribe. 04/03/2016. 11:16 PM.   History   Chief Complaint Chief Complaint  Patient presents with  . Mass   HPI Christopher Moses is a 60 y.o. male who presents to the Emergency Department complaining of neck swelling onset 10 days ago. Pt was seen by his PCP one week ago and prescribed Augmentin which he has taken with no relief. This is day 7 of Augmentin. He was seen by his PCP again  yesterday and was given an ultrasound which found fluid. He was advised to come to the ED for a CT scan if the the swelling did not decrease. He has an appointment with ENT on 04/05/16. He denies fever, dental pain, and postnasal drip.   The history is provided by the patient. No language interpreter was used.    Past Medical History:  Diagnosis Date  . ADHD (attention deficit hyperactivity disorder)    By psych 09/2009  . Lymphadenopathy    Patient Active Problem List   Diagnosis Date Noted  . Annual physical exam 06/16/2013  . 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 DEGENERATIVE JOINT DISEASE 08/30/2010  . ADHD 09/26/2009    Past Surgical History:  Procedure Laterality Date  . APPENDECTOMY    . KNEE ARTHROSCOPY     Left   . VASECTOMY  ~2012    Home Medications    Prior to Admission medications   Medication Sig Start Date End Date Taking? Authorizing Provider  amoxicillin-clavulanate (AUGMENTIN) 875-125 MG tablet Take 1 tablet by mouth 2 (two) times daily. 03/27/16   Percell Miller Saguier, PA-C  atorvastatin (LIPITOR) 20 MG tablet Take 1 tablet (20 mg total) by mouth daily. Patient not taking: Reported on 04/02/2016 06/22/13   Colon Branch, MD  diclofenac (VOLTAREN) 75 MG EC tablet Take 1 tablet (75 mg total) by mouth 2  (two) times daily. 04/02/16   Percell Miller Saguier, PA-C  HYDROcodone-acetaminophen (NORCO) 5-325 MG tablet Take 1 tablet by mouth every 6 (six) hours as needed for moderate pain. 04/02/16   Mackie Pai, PA-C   Family History Family History  Problem Relation Age of Onset  . Heart attack Father     MI @ 77 also  . Prostate cancer Other 33    Uncle  . Colon cancer Neg Hx   . Diabetes Neg Hx   . Hypertension Neg Hx   . Rectal cancer Neg Hx   . Stomach cancer Neg Hx    Social History Social History  Substance Use Topics  . Smoking status: Current Every Day Smoker    Packs/day: 1.00  . Smokeless tobacco: Never Used     Comment: 1 ppd   . Alcohol use 3.0 oz/week    5 Glasses of wine per week     Comment: Social     Allergies   Codeine sulfate   Review of Systems Review of Systems  Constitutional: Negative for fever.  HENT: Negative for dental problem and postnasal drip.        +lesion to right neck  All other systems reviewed and are negative.  Physical Exam Updated Vital Signs BP 140/80 (BP Location: Left Arm)   Pulse 84   Temp 97.5 F (36.4 C) (Oral)   Resp 20  Wt 185 lb 9.6 oz (84.2 kg)   SpO2 97%   BMI 28.22 kg/m   Physical Exam  Constitutional: He is oriented to person, place, and time. He appears well-developed and well-nourished. No distress.  HENT:  Head: Normocephalic and atraumatic.  Mouth/Throat: Oropharynx is clear and moist. No oropharyngeal exudate.  Moist mucous membranes   Eyes: Conjunctivae are normal. Pupils are equal, round, and reactive to light.  Neck: Normal range of motion. Neck supple. No JVD present. No tracheal deviation present.  1.75in  freely mobile ballotable lesion, not matted over the posterior border of the sternoclidomastoid, possibly contiguous with the right parotid gland  Cardiovascular: Normal rate, regular rhythm and normal heart sounds.   Pulmonary/Chest: Effort normal and breath sounds normal. No stridor. No respiratory  distress. He has no wheezes. He has no rales.  Abdominal: Soft. Bowel sounds are normal. He exhibits no distension.  Lymphadenopathy:    He has no cervical adenopathy.  Neurological: He is alert and oriented to person, place, and time. He has normal reflexes.  Skin: Skin is warm and dry. Capillary refill takes less than 2 seconds.  Psychiatric: He has a normal mood and affect. His behavior is normal.  Nursing note and vitals reviewed.   ED Treatments / Results  DIAGNOSTIC STUDIES:  Oxygen Saturation is 97% on RA, normal by my interpretation.    COORDINATION OF CARE:  11:15 PM Discussed treatment plan with pt at bedside and pt agreed to plan.   Radiology US Soft Tissue Head/neck  Result Date: 04/02/2016 CLINICAL DATA:  Palpable region of soft tissue swelling at the angles of the mandible in the right neck. EXAM: ULTRASOUND OF HEAD/NECK SOFT TISSUES TECHNIQUE: Ultrasound examination of the head and neck soft tissues was performed in the area of clinical concern. COMPARISON:  None. FINDINGS: In the region of palpable abnormality, and ovoid complex cystic structure is identified measuring roughly 2.0 x 1.4 x 1.9 cm. This demonstrates internal complex fluid. Smaller adjacent complex cystic region measures 0.7 x 0.5 x 0.5 cm. Differential considerations include necrotic lymph nodes versus abscess. Recommend further evaluation with CT of the neck with IV contrast. IMPRESSION: Complex cystic/ necrotic abnormality in the right neck corresponding to the palpable abnormality and measuring 2 cm in greatest diameter. Adjacent smaller cystic abnormality is also identified measuring 0.7 cm. Differential considerations include necrotic lymph nodes versus abscess/infection. Recommend further evaluation with CT of the neck with contrast. Electronically Signed   By: Aletta Edouard M.D.   On: 04/02/2016 11:01    Procedures Procedures (including critical care time)  Medications Ordered in ED Medications -  No data to display   Initial Impression / Assessment and Plan / ED Course  I have reviewed the triage vital signs and the nursing notes.  Pertinent labs & imaging results that were available during my care of the patient were reviewed by me and considered in my medical decision making (see chart for details).  Clinical Course   Vitals:   04/03/16 2256 04/04/16 0104  BP: 140/80 123/74  Pulse: 84 74  Resp: 20 18  Temp: 97.5 F (36.4 C)    Results for orders placed or performed during the hospital encounter of 04/03/16  CBC with Differential  Result Value Ref Range   WBC 11.8 (H) 4.0 - 10.5 K/uL   RBC 4.46 4.22 - 5.81 MIL/uL   Hemoglobin 13.4 13.0 - 17.0 g/dL   HCT 40.1 39.0 - 52.0 %   MCV 89.9 78.0 - 100.0  fL   MCH 30.0 26.0 - 34.0 pg   MCHC 33.4 30.0 - 36.0 g/dL   RDW 14.1 11.5 - 15.5 %   Platelets 327 150 - 400 K/uL   Neutrophils Relative % 63 %   Neutro Abs 7.5 1.7 - 7.7 K/uL   Lymphocytes Relative 23 %   Lymphs Abs 2.7 0.7 - 4.0 K/uL   Monocytes Relative 10 %   Monocytes Absolute 1.1 (H) 0.1 - 1.0 K/uL   Eosinophils Relative 4 %   Eosinophils Absolute 0.5 0.0 - 0.7 K/uL   Basophils Relative 0 %   Basophils Absolute 0.0 0.0 - 0.1 K/uL  Basic metabolic panel  Result Value Ref Range   Sodium 138 135 - 145 mmol/L   Potassium 3.7 3.5 - 5.1 mmol/L   Chloride 107 101 - 111 mmol/L   CO2 22 22 - 32 mmol/L   Glucose, Bld 102 (H) 65 - 99 mg/dL   BUN 13 6 - 20 mg/dL   Creatinine, Ser 0.69 0.61 - 1.24 mg/dL   Calcium 8.7 (L) 8.9 - 10.3 mg/dL   GFR calc non Af Amer >60 >60 mL/min   GFR calc Af Amer >60 >60 mL/min   Anion gap 9 5 - 15   Ct Soft Tissue Neck W Contrast  Result Date: 04/04/2016 CLINICAL DATA:  RIGHT neck mass for 10 days, neck swelling for 2 days with pain. Symptoms not improved with antibiotics. EXAM: CT NECK WITH CONTRAST TECHNIQUE: Multidetector CT imaging of the neck was performed using the standard protocol following the bolus administration of  intravenous contrast. CONTRAST:  13mL ISOVUE-300 IOPAMIDOL (ISOVUE-300) INJECTION 61% COMPARISON:  Neck ultrasound April 02, 2016 FINDINGS: Pharynx and larynx: Apposition of true vocal cord compatible phonation. RIGHT laryngocele. Aerodigestive tract otherwise unremarkable. Salivary glands: Within or abutting the tail of the superficial RIGHT parotid lobe is a 15 x 12 x 20 mm irregular cystic mass with thickened irregular peripheral enhancement corresponding to sonographic abnormality. Trans spatial RIGHT neck effusion extends into RIGHT submandibular space. RIGHT facial soft tissue swelling, subcutaneous fat stranding and thickened platysma. Major salivary glands are otherwise unremarkable though, streak artifact from dental hardware limits assessment of the sublingual glands. Thyroid: Normal. Lymph nodes: Cystic RIGHT neck mass may represent lymph node, no additional lymphadenopathy by CT size criteria. Vascular: Minimal calcific atherosclerosis RIGHT carotid bifurcation. Limited intracranial: Normal. Visualized orbits: Normal. Mastoids and visualized paranasal sinuses: Mild paranasal sinus mucosal thickening. Mastoid air cells are well-aerated. Skeleton: Maxillary dental implants with periapical lucency compatible with hardware failure. Moderate LEFT C2-3 facet arthropathy. No acute osseous process. Upper chest: Lung apices are clear. No superior mediastinal lymphadenopathy. Minimal calcific atherosclerosis of the aortic arch. Other: Negative. IMPRESSION: Abscess versus inflamed necrotic RIGHT neck mass contiguous with or possibly arising from the parotid tail. Recommend histopathologic correlation to underlying tumor and/or nodal metastasis. Electronically Signed   By: Elon Alas M.D.   On: 04/04/2016 00:27   US Soft Tissue Head/neck  Result Date: 04/02/2016 CLINICAL DATA:  Palpable region of soft tissue swelling at the angles of the mandible in the right neck. EXAM: ULTRASOUND OF HEAD/NECK SOFT  TISSUES TECHNIQUE: Ultrasound examination of the head and neck soft tissues was performed in the area of clinical concern. COMPARISON:  None. FINDINGS: In the region of palpable abnormality, and ovoid complex cystic structure is identified measuring roughly 2.0 x 1.4 x 1.9 cm. This demonstrates internal complex fluid. Smaller adjacent complex cystic region measures 0.7 x 0.5 x 0.5 cm. Differential  considerations include necrotic lymph nodes versus abscess. Recommend further evaluation with CT of the neck with IV contrast. IMPRESSION: Complex cystic/ necrotic abnormality in the right neck corresponding to the palpable abnormality and measuring 2 cm in greatest diameter. Adjacent smaller cystic abnormality is also identified measuring 0.7 cm. Differential considerations include necrotic lymph nodes versus abscess/infection. Recommend further evaluation with CT of the neck with contrast. Electronically Signed   By: Aletta Edouard M.D.   On: 04/02/2016 11:01   Medications  clindamycin (CLEOCIN) capsule 300 mg (not administered)  iopamidol (ISOVUE-300) 61 % injection 80 mL (80 mLs Intravenous Contrast Given 04/03/16 2346)  Differential: 1. Cyst 2. Abscess 3. Necrotic neck/parotid mass  122 case d/w Dr. Constance Holster of ENT.  CT report reviewed via phone.  Does not need admission at this time.  May change antibiotics to clindamycin for better staph coverage and follow up in the office this week.  Is safe for discharge.    Final Clinical Impressions(s) / ED Diagnoses   New Prescriptions New Prescriptions   No medications on file  I reviewed the CT findings with the patient and wife and discussed consult with Dr. Constance Holster. Explained the differential diagnosis.  Will change antibiotic to clindamycin.  All questions answered to patient's satisfaction. Based on history and exam patient has been appropriately medically screened and emergency conditions excluded. Patient is stable for discharge at this time. Follow up with  your PMD for recheck in 2 days and strict return precautions given. Patient has a follow up with [facility] on Thursday and is instructed to keep this follow up appointment.  EDP apologized for the wait and that patient's trip was cut short.  I personally performed the services described in this documentation, which was scribed in my presence. The recorded information has been reviewed and is accurate.      Veatrice Kells, MD 04/04/16 [phone]

## 2016-04-03 NOTE — Telephone Encounter (Signed)
I talked with pt early afternoon on Tuesday. He states he had some swelling toward his neck/worsening. I explained to pt be seen in ED. He was in [place] on business trip. Pt states he is taking a flight today and coming home early. So I advised today when he gets back to [facility] in to go directly to the ED. I recommended ED at the [facility]. Pt agreed that he would go.   I explained based on his recent history would likely need IV antibiotics and CT of neck.

## 2016-04-04 ENCOUNTER — Encounter (HOSPITAL_BASED_OUTPATIENT_CLINIC_OR_DEPARTMENT_OTHER): Payer: Self-pay | Admitting: Emergency Medicine

## 2016-04-04 ENCOUNTER — Telehealth: Payer: Self-pay | Admitting: Medical

## 2016-04-04 MED ORDER — CLINDAMYCIN HCL 300 MG PO CAPS: 300.0000 mg | ORAL_CAPSULE | Freq: Four times a day (QID) | ORAL | 0 refills | Status: DC

## 2016-04-04 MED ORDER — CLINDAMYCIN HCL 150 MG PO CAPS
300.0000 mg | ORAL_CAPSULE | Freq: Once | ORAL | Status: AC
  Administered 2016-04-04: 300 mg via ORAL
  Filled 2016-04-04: qty 2

## 2016-04-04 NOTE — Telephone Encounter (Signed)
Is patient aware of his ENT appointment?

## 2016-04-04 NOTE — Telephone Encounter (Signed)
[facility] spoke with patient, he is scheduled for 04/05/16 @ 1:50 w/ Dr Redmond Baseman

## 2016-04-04 NOTE — ED Notes (Signed)
Pt verbalizes understanding of d/c instructions and denies any further needs at this time. 

## 2016-04-04 NOTE — ED Notes (Signed)
Contacted ENT for consult [phone])

## 2016-04-05 ENCOUNTER — Telehealth: Payer: Self-pay | Admitting: Medical

## 2016-04-05 DIAGNOSIS — D72829 Elevated white blood cell count, unspecified: Secondary | ICD-10-CM

## 2016-04-05 NOTE — Telephone Encounter (Signed)
I called patient to find out how he is and how ENT visit went. Left number for pt to call back.

## 2016-04-06 NOTE — Telephone Encounter (Signed)
Placed stat order for Monday if still having fevers or chills. Test result would be available for his ent on Tuesday.

## 2017-02-25 ENCOUNTER — Ambulatory Visit (INDEPENDENT_AMBULATORY_CARE_PROVIDER_SITE_OTHER): Payer: BLUE CROSS/BLUE SHIELD | Admitting: Medical

## 2017-02-25 ENCOUNTER — Ambulatory Visit (HOSPITAL_BASED_OUTPATIENT_CLINIC_OR_DEPARTMENT_OTHER)
Admission: RE | Admit: 2017-02-25 | Discharge: 2017-02-25 | Disposition: A | Discharge status: Home / Self Care | Payer: BLUE CROSS/BLUE SHIELD | Source: Ambulatory Visit | Attending: Medical | Admitting: Medical

## 2017-02-25 ENCOUNTER — Encounter: Payer: Self-pay | Admitting: Medical

## 2017-02-25 ENCOUNTER — Telehealth: Payer: Self-pay | Admitting: Internal Medicine

## 2017-02-25 ENCOUNTER — Telehealth: Payer: Self-pay | Admitting: Medical

## 2017-02-25 VITALS — BP 113/68 | HR 67 | Temp 98.3°F | Resp 16 | Ht 68.0 in | Wt 188.6 lb

## 2017-02-25 DIAGNOSIS — F17200 Nicotine dependence, unspecified, uncomplicated: Secondary | ICD-10-CM

## 2017-02-25 DIAGNOSIS — M7989 Other specified soft tissue disorders: Secondary | ICD-10-CM | POA: Diagnosis not present

## 2017-02-25 DIAGNOSIS — M79669 Pain in unspecified lower leg: Secondary | ICD-10-CM | POA: Insufficient documentation

## 2017-02-25 DIAGNOSIS — R739 Hyperglycemia, unspecified: Secondary | ICD-10-CM

## 2017-02-25 DIAGNOSIS — G629 Polyneuropathy, unspecified: Secondary | ICD-10-CM

## 2017-02-25 LAB — COMPREHENSIVE METABOLIC PANEL
ALBUMIN: 4 g/dL (ref 3.5–5.2)
ALK PHOS: 42 U/L (ref 39–117)
ALT: 24 U/L (ref 0–53)
AST: 16 U/L (ref 0–37)
BILIRUBIN TOTAL: 0.4 mg/dL (ref 0.2–1.2)
BUN: 14 mg/dL (ref 6–23)
CALCIUM: 8.7 mg/dL (ref 8.4–10.5)
CO2: 31 meq/L (ref 19–32)
Chloride: 108 mEq/L (ref 96–112)
Creatinine, Ser: 0.82 mg/dL (ref 0.40–1.50)
GFR: 102.02 mL/min (ref >60.00)
Glucose, Bld: 108 mg/dL — ABNORMAL HIGH (ref 70–99)
Potassium: 4.3 mEq/L (ref 3.5–5.1)
Sodium: 140 mEq/L (ref 135–145)
TOTAL PROTEIN: 6.4 g/dL (ref 6.0–8.3)

## 2017-02-25 LAB — FOLATE

## 2017-02-25 LAB — VITAMIN B12: Vitamin B-12: 475 pg/mL (ref 211–911)

## 2017-02-25 MED ORDER — GABAPENTIN 100 MG PO CAPS: 100.0000 mg | ORAL_CAPSULE | Freq: Every day | ORAL | 3 refills | Status: DC

## 2017-02-25 NOTE — Patient Instructions (Addendum)
You have one day of neuropathathic/nerve type pain in both lower extremities. Difficult to pinpoint exactly cause presently. Will prescribe gabapentin to use at night. Will get metabolic panel, Y07, B6, B1 and folate lab work today.  We'll see how you're nerve type pain response to gabapentin and expand workup if no cause found for his nerve  like pain continues.  Consideration of differential diagnoses today were restless leg syndrome, vascular issues and lower back pain with radiating nerve pain. None of these the clinical picture today.  For your reported swelling of legs will go ahead and get chest x-ray. I do want you to watch and let me know if swelling gets worse or if he gets pain behind your knees. In that event would get lower extremity ultrasound..  Follow-up in 7-10 days or as needed.

## 2017-02-25 NOTE — Telephone Encounter (Signed)
Future A1c order placed for hyperglycemia

## 2017-02-25 NOTE — Telephone Encounter (Signed)
[facility] - Client TELEPHONE ADVICE RECORD [facility]  Patient Name: Christopher Moses  DOB: 05-03-1956    Initial Comment Caller states has swelling and numbness in both legs; numb at back of knees; began last evening; affecting sleep;    Nurse Assessment  Nurse: Harlow Mares, RN, Suanne Marker Date/Time (Eastern Time): 02/25/2017 9:12:53 AM  Confirm and document reason for call. If symptomatic, describe symptoms. ---Caller states has swelling and numbness in both legs; numb at back of knees; began last evening; affecting sleep; denies back pain. Reports that his legs are uncomfortable. Denies SOB.  Does the patient have any new or worsening symptoms? ---Yes  Will a triage be completed? ---Yes  Related visit to physician within the last 2 weeks? ---No  Does the PT have any chronic conditions? (i.e. diabetes, asthma, etc.) ---No  Is this a behavioral health or substance abuse call? ---No     Guidelines    Guideline Title Affirmed Question Affirmed Notes  Leg Pain Numbness in a leg or foot (i.e., loss of sensation)    Final Disposition User   See Physician within 24 Hours Harlow Mares, RN, Suanne Marker    Comments  Appt scheduled with Dr. Wylie Hail at the [place] office today at 11:30am   Referrals  REFERRED TO PCP OFFICE   Disagree/Comply: Comply

## 2017-02-25 NOTE — Progress Notes (Signed)
Subjective:    Patient ID: Christopher Moses, male    DOB: 1955-08-31, 61 y.o.   MRN: [id number]  HPI   Pt in for some mild tingling sensation at times to his legs. Just started last night. He denies any back pain.  No cramping of his legs. No history of diabetes.   Legs feel faint sharp uncomfortable sensation.  He feels like he needs to move legs but when he did no decrease of symptoms.  Leg feel little tight and swollen. No acute weight gain. No sob. No popliteal pain.   Review of Systems  Constitutional: Negative for chills, fatigue and fever.  Respiratory: Negative for cough, chest tightness, shortness of breath and wheezing.   Genitourinary: Negative for decreased urine volume, dysuria, flank pain, frequency, scrotal swelling and testicular pain.  Musculoskeletal: Negative for back pain.       Sharp pain pain lower legs and calf and hamstrings regions.  Neurological: Negative for seizures, syncope, facial asymmetry, speech difficulty, weakness and light-headedness.       Nerve type pain reported.  Hematological: Negative for adenopathy. Does not bruise/bleed easily.  Psychiatric/Behavioral: Negative for behavioral problems and confusion.   Past Medical History:  Diagnosis Date  . ADHD (attention deficit hyperactivity disorder)    By psych 09/2009  . Lymphadenopathy   . Neck abrasion, infected 2017   R side, seen and treated by ENT     Social History   Social History  . Marital status: Married    Spouse name: N/A  . Number of children: 4  . 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 Smoking status: Current Every Day Smoker    Packs/day: 1.00  . Smokeless tobacco: Never Used     Comment: 1 ppd   . Alcohol use 3.0 oz/week    5 Glasses of wine per week     Comment: Social  . Drug use: No  . Sexual activity: Not on file   Other Topics Concern  . Not on file   Social History Narrative   Lives w/ wife      Past Surgical History:  Procedure Laterality Date  . APPENDECTOMY    . KNEE ARTHROSCOPY     Left   . VASECTOMY  ~2012    Family History  Problem Relation Age of Onset  . Heart attack Father        MI @ 15 also  . Prostate cancer Other 19       Uncle  . Colon cancer Neg Hx   . Diabetes Neg Hx   . Hypertension Neg Hx   . Rectal cancer Neg Hx   . Stomach cancer Neg Hx     Allergies  Allergen Reactions  . Codeine Sulfate     REACTION: hives    Current Outpatient Prescriptions on File Prior to Visit  Medication Sig Dispense Refill  . atorvastatin (LIPITOR) 20 MG tablet Take 1 tablet (20 mg total) by mouth daily. (Patient not taking: Reported on 02/25/2017) 30 tablet 1  . clindamycin (CLEOCIN) 300 MG capsule Take 1 capsule (300 mg total) by mouth 4 (four) times daily. X 7 days (Patient not taking: Reported on 02/25/2017) 28 capsule 0  . diclofenac (VOLTAREN) 75 MG EC tablet Take 1 tablet (75 mg total) by mouth 2 (two) times daily. (Patient not taking: Reported on 02/25/2017) 20 tablet o  . HYDROcodone-acetaminophen (NORCO) 5-325  MG tablet Take 1 tablet by mouth every 6 (six) hours as needed for moderate pain. (Patient not taking: Reported on 02/25/2017) 12 tablet 0   No current facility-administered medications on file prior to visit.     BP 113/68   Pulse 67   Temp 98.3 F (36.8 C) (Oral)   Resp 16   Ht 5\' 8"  (1.727 m)   Wt 188 lb 9.6 oz (85.5 kg)   SpO2 99%   BMI 28.68 kg/m      Objective:   Physical Exam   General Appearance- Not in acute distress.    Chest and Lung Exam Auscultation: Breath sounds:-Normal. Clear even and unlabored. Adventitious sounds:- No Adventitious sounds.  Cardiovascular Auscultation:Rythm - Regular, rate and rythm. Heart Sounds -Normal heart sounds.  Abdomen Inspection:-Inspection Normal.  Palpation/Perucssion: Palpation and Percussion of the abdomen reveal- Non Tender, No Rebound tenderness, No rigidity(Guarding) and No  Palpable abdominal masses.  Liver:-Normal.  Spleen:- Normal.   Back No Mid lumbar spine tenderness to palpation.   Lower ext neurologic  L5-S1 sensation intact bilaterally. Normal patellar reflexes bilaterally. No foot drop bilaterally.  Lower ext- normal pedal pulses bilaterally. Normal capillary refill. No obvious pretibial edema. Negative Homans sign.       Assessment & Plan:  You have one day of neuropathathic/nerve type pain in both lower extremities. Difficult to pinpoint exactly cause presently. Will prescribe gabapentin to use at night. Will get metabolic panel, Z66,A6, B1 and folate lab work today.  We'll see how you're nerve type pain response to gabapentin and expand workup if no cause found for his nerve  like pain continues.  Consideration of differential diagnoses today were restless leg syndrome, vascular issues and lower back pain with radiating nerve pain. None of these the clinical picture today.  For your reported swelling of legs will go ahead and get chest x-ray. I do want you to watch and let me know if swelling gets worse or if he gets pain behind your knees. In that event would get lower extremity ultrasound..  Follow-up in 7-10 days or as needed.  Mailani Degroote, Percell Miller, PA-C

## 2017-03-01 ENCOUNTER — Other Ambulatory Visit (INDEPENDENT_AMBULATORY_CARE_PROVIDER_SITE_OTHER): Payer: BLUE CROSS/BLUE SHIELD

## 2017-03-01 DIAGNOSIS — R739 Hyperglycemia, unspecified: Secondary | ICD-10-CM

## 2017-03-01 LAB — HEMOGLOBIN A1C: Hgb A1c MFr Bld: 6 % (ref 4.6–6.5)

## 2017-03-01 LAB — VITAMIN B6: Vitamin B6: 28.5 ng/mL — ABNORMAL HIGH (ref 2.1–21.7)

## 2017-03-02 LAB — VITAMIN B1: Vitamin B1 (Thiamine): 11 nmol/L (ref 8–30)

## 2017-03-05 ENCOUNTER — Encounter: Payer: Self-pay | Admitting: Medical

## 2017-03-05 ENCOUNTER — Telehealth: Payer: Self-pay | Admitting: Medical

## 2017-03-05 DIAGNOSIS — M5441 Lumbago with sciatica, right side: Secondary | ICD-10-CM

## 2017-03-05 DIAGNOSIS — M5442 Lumbago with sciatica, left side: Secondary | ICD-10-CM

## 2017-03-05 NOTE — Telephone Encounter (Signed)
Lumbar spine x-ray order placed.

## 2017-03-07 ENCOUNTER — Encounter: Payer: Self-pay | Admitting: Internal Medicine

## 2017-03-07 ENCOUNTER — Ambulatory Visit (INDEPENDENT_AMBULATORY_CARE_PROVIDER_SITE_OTHER): Payer: BLUE CROSS/BLUE SHIELD | Admitting: Internal Medicine

## 2017-03-07 VITALS — BP 126/70 | HR 71 | Temp 98.0°F | Resp 14 | Ht 68.0 in | Wt 191.5 lb

## 2017-03-07 DIAGNOSIS — R202 Paresthesia of skin: Secondary | ICD-10-CM | POA: Diagnosis not present

## 2017-03-07 DIAGNOSIS — R739 Hyperglycemia, unspecified: Secondary | ICD-10-CM | POA: Diagnosis not present

## 2017-03-07 DIAGNOSIS — Z720 Tobacco use: Secondary | ICD-10-CM

## 2017-03-07 NOTE — Progress Notes (Signed)
Subjective:    Patient ID: Christopher Moses, male    DOB: 10-21-1955, 61 y.o.   MRN: [id number]  DOS:  03/07/2017 Type of visit - description : f/u Interval history: Was recently seen at this office by another provider with tingling/pain at the posterior aspect of both legs. No  back pain. He was felt to have some sort of neuropathic pain, gabapentin was rx, labs were unremarkable except for a A1c of 6.0. Since that visit, he realized that he has been more active with his new boat and he also developed some back ache. He used a heating pad x 3 nights and shortly after the pain was resolved. Currently asymptomatic. Additionally, we also talk about tobacco, he still smokes a pack a day.  Review of Systems Negative claudication. No bladder or bowel incontinence  Past Medical History:  Diagnosis Date  . ADHD (attention deficit hyperactivity disorder)    By psych 09/2009  . Lymphadenopathy   . Neck abrasion, infected 2017   R side, seen and treated by ENT    Past Surgical History:  Procedure Laterality Date  . APPENDECTOMY    . KNEE ARTHROSCOPY     Left   . VASECTOMY  ~2012    Social History   Social History  . Marital status: Married    Spouse name: N/A  . Number of children: 4  . 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 Smoking status: Current Every Day Smoker    Packs/day: 1.00  . Smokeless tobacco: Never Used     Comment: 1 ppd   . Alcohol use 3.0 oz/week    5 Glasses of wine per week     Comment: Social  . Drug use: No  . Sexual activity: Not on file   Other Topics Concern  . Not on file   Social History Narrative   Lives w/ wife      Allergies as of 03/07/2017      Reactions   Codeine Sulfate    REACTION: hives      Medication List    as of 03/07/2017 11:59 PM   You have not been prescribed any medications.        Objective:   Physical Exam BP 126/70 (BP Location: Left Arm, Patient  Position: Sitting, Cuff Size: Small)   Pulse 71   Temp 98 F (36.7 C) (Oral)   Resp 14   Ht 5\' 8"  (1.727 m)   Wt 191 lb 8 oz (86.9 kg)   SpO2 98%   BMI 29.12 kg/m  General:   Well developed, well nourished . NAD.  HEENT:  Normocephalic . Face symmetric, atraumatic MSK: Low back no TTP Skin: Not pale. Not jaundice Neurologic:  alert & oriented X3.  Speech normal, gait appropriate for age and unassisted. Posture and gait not antalgic. DTRs symmetric Psych--  Cognition and judgment appear intact.  Cooperative with normal attention span and concentration.  Behavior appropriate. No anxious or depressed appearing.      Assessment & Plan:   Assessment ADD DX 2011 by psychiatry Urticaria  Plan: Paresthesias, lower extremities: See last OV, had paresthesias, apparently related to back pain, see HPI. Currently asx and taking no medications. Hyperglycemia: last A1c 6.0, he is at risk of diabetes, recommend to stay active, eat healthy, watch his weight. He verbalized understanding. Tobacco abuse: We discussed treatment options such as nicotine patches, Wellbutrin,  Chantix. Will call when ready. RTC at his convenience for a CPX.

## 2017-03-07 NOTE — Patient Instructions (Signed)
Schedule a physical (fasting) at your earliest convenience

## 2017-03-07 NOTE — Progress Notes (Signed)
Pre visit review using our clinic review tool, if applicable. No additional management support is needed unless otherwise documented below in the visit note. 

## 2017-04-19 ENCOUNTER — Ambulatory Visit (INDEPENDENT_AMBULATORY_CARE_PROVIDER_SITE_OTHER): Payer: BLUE CROSS/BLUE SHIELD | Admitting: Internal Medicine

## 2017-04-19 ENCOUNTER — Encounter: Payer: Self-pay | Admitting: Internal Medicine

## 2017-04-19 VITALS — BP 118/60 | HR 75 | Temp 97.8°F | Resp 14 | Ht 68.0 in | Wt 188.0 lb

## 2017-04-19 DIAGNOSIS — J40 Bronchitis, not specified as acute or chronic: Secondary | ICD-10-CM | POA: Diagnosis not present

## 2017-04-19 MED ORDER — AZITHROMYCIN 250 MG PO TABS: ORAL_TABLET | ORAL | 0 refills | Status: AC

## 2017-04-19 NOTE — Patient Instructions (Addendum)
Come back for a physical exam at your convenience  =====   Rest, fluids , tylenol  For cough:  Take Mucinex DM twice a day as needed until better  For nasal congestion: Use OTC Nasocort or Flonase : 2 nasal sprays on each side of the nose in the morning until you feel better   Avoid decongestants such as  Pseudoephedrine or phenylephrine    Take the antibiotic as prescribed  (zithromax) only if you are not getting better in the next 3-4  Days    Call if not gradually better over the next  10 days  Call anytime if the symptoms are severe

## 2017-04-19 NOTE — Progress Notes (Signed)
Pre visit review using our clinic review tool, if applicable. No additional management support is needed unless otherwise documented below in the visit note. 

## 2017-04-19 NOTE — Progress Notes (Signed)
   Subjective:    Patient ID: Christopher Moses, male    DOB: January 25, 1956, 61 y.o.   MRN: [id number]  DOS:  04/19/2017 Type of visit - description : acute Interval history: Sx started a week ago: Runny nose, cough with mild sputum production. Had fever the first today. Overall is feeling better but still coughing. Taking Mucinex.  Review of Systems No nausea, vomiting, diarrhea. Mild generalized achy sensation. No headaches. + Sinus congestion with minimal greenish discharge.  Past Medical History:  Diagnosis Date  . ADHD (attention deficit hyperactivity disorder)    By psych 09/2009  . Lymphadenopathy   . Neck abrasion, infected 2017   R side, seen and treated by ENT    Past Surgical History:  Procedure Laterality Date  . APPENDECTOMY    . KNEE ARTHROSCOPY     Left   . VASECTOMY  ~2012    Social History   Social History  . Marital status: Married    Spouse name: N/A  . Number of children: 4  . 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 Smoking status: Current Every Day Smoker    Packs/day: 1.00  . Smokeless tobacco: Never Used     Comment: 1 ppd   . Alcohol use 3.0 oz/week    5 Glasses of wine per week     Comment: Social  . Drug use: No  . Sexual activity: Not on file   Other Topics Concern  . Not on file   Social History Narrative   Lives w/ wife      Allergies as of 04/19/2017      Reactions   Codeine Sulfate    REACTION: hives      Medication List       Accurate as of 04/19/17 11:59 PM. Always use your most recent med list.          azithromycin 250 MG tablet Commonly known as:  ZITHROMAX Z-PAK 2 tabs a day the first day, then 1 tab a day x 4 days          Objective:   Physical Exam BP 118/60 (BP Location: Left Arm, Patient Position: Sitting, Cuff Size: Small)   Pulse 75   Temp 97.8 F (36.6 C) (Oral)   Resp 14   Ht 5\' 8"  (1.727 m)   Wt 188 lb (85.3 kg)   SpO2 97%    BMI 28.59 kg/m  General:   Well developed, well nourished . NAD.  HEENT:  Normocephalic . Face symmetric, atraumatic. TMs slightly bulge but not red. Nose congested, sinuses no TTP Lungs:  Mild large airway congestion that clears with cough Normal respiratory effort, no intercostal retractions, no accessory muscle use. Heart: RRR,  no murmur.  No pretibial edema bilaterally  Skin: Not pale. Not jaundice Neurologic:  alert & oriented X3.  Speech normal, gait appropriate for age and unassisted Psych--  Cognition and judgment appear intact.  Cooperative with normal attention span and concentration.  Behavior appropriate. No anxious or depressed appearing.      Assessment & Plan:   Assessment ADD DX 2011 by psychiatry [facility]  Plan: Mild bronchitis: Heavy smoker with respiratory sxs as described above, likely mild bronchitis. Will treat conservatively with Flonase, Mucinex, rest and Tylenol. If not better start a Z-Pak. See instructions. Again recommend to come for a physical exam at his convenience.

## 2017-12-14 IMAGING — CT CT NECK W/ CM
4 of 5 series · 15 of 33 positions shown, 17 images · IV contrast (iopamidol)
Comparison: Neck ultrasound April 02, 2016

CLINICAL DATA: RIGHT neck mass for 10 days, neck swelling for 2
days with pain. Symptoms not improved with antibiotics.

EXAM:
CT NECK WITH CONTRAST
TECHNIQUE: Multidetector CT imaging of the neck was performed using the
standard protocol following the bolus administration of intravenous
contrast.
CONTRAST:  80mL KFP8CI-OII IOPAMIDOL (KFP8CI-OII) INJECTION 61%

[Series 4: axial neck · axial · 0.44mm/px · z∈[-240,-82]mm · 4 of 133 slices shown, 5 images]
[im 27/133  soft-tissue]
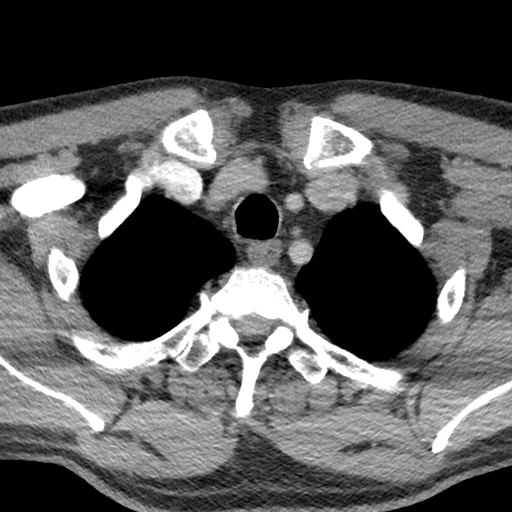
[im 27/133  bone]
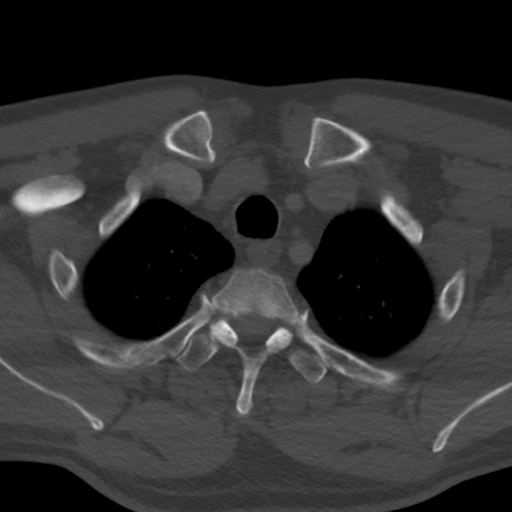
[im 53/133  bone]
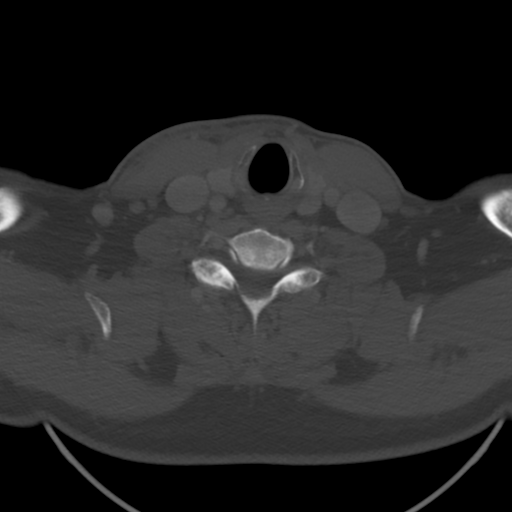
[im 80/133  bone]
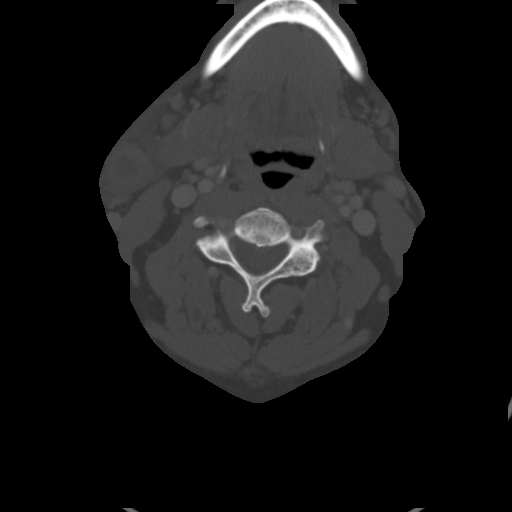
[im 106/133  bone]
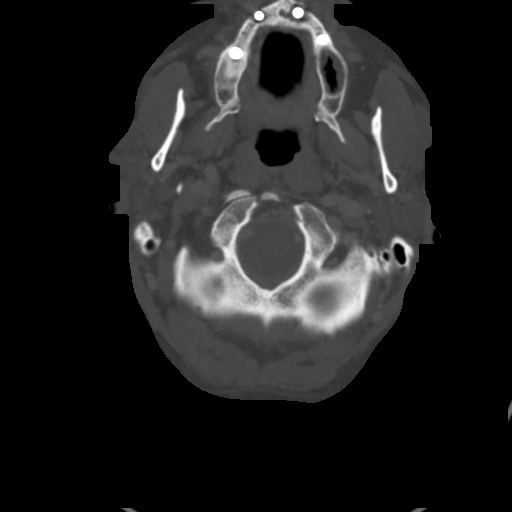

[Series 7: sag neck · sagittal · 0.54mm/px · 5 of 159 slices shown, 6 images]
[im 53/159  bone]
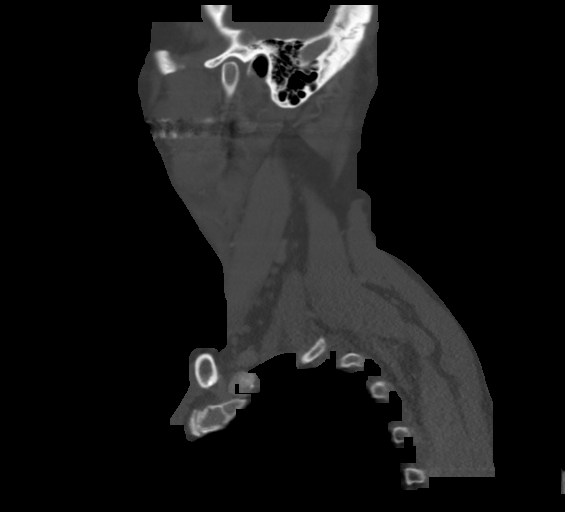
[im 66/159  bone]
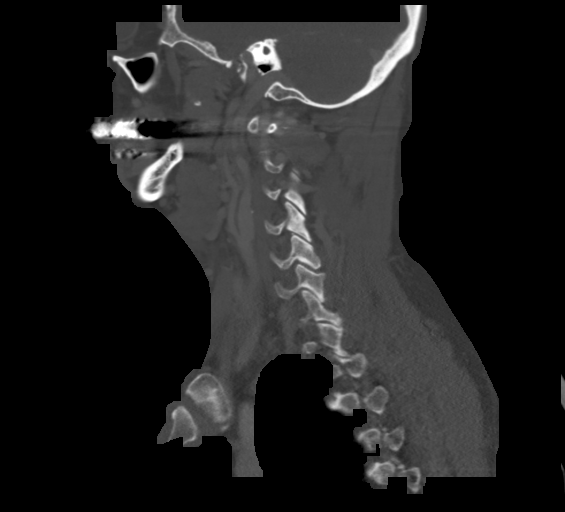
[im 80/159  soft-tissue]
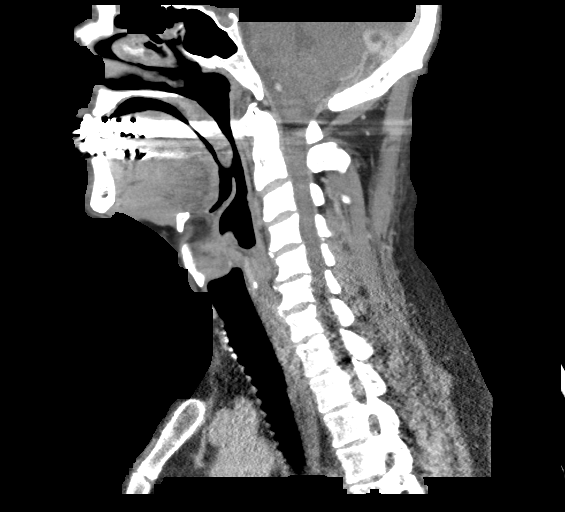
[im 80/159  bone]
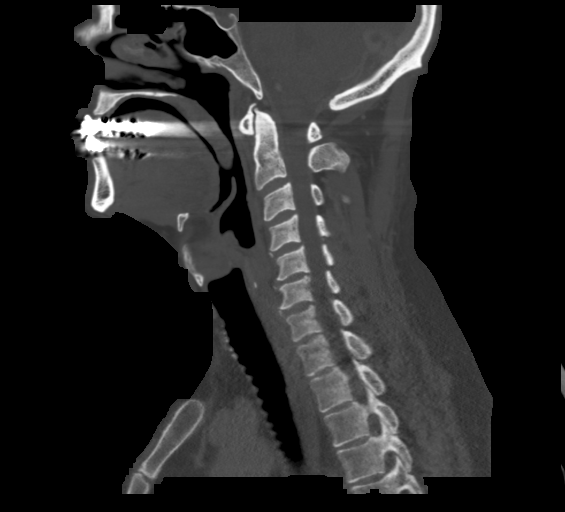
[im 93/159  bone]
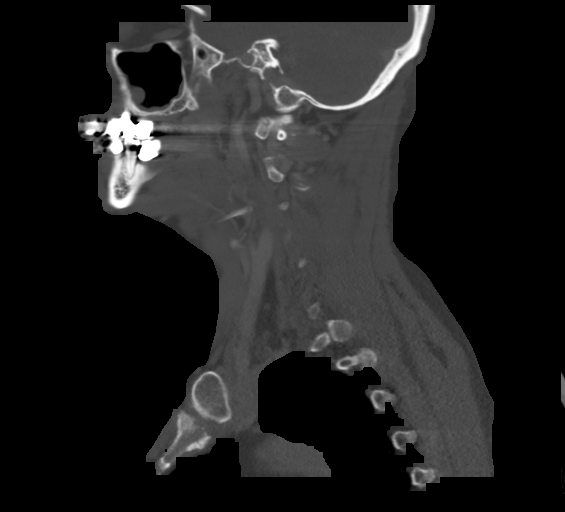
[im 106/159  bone]
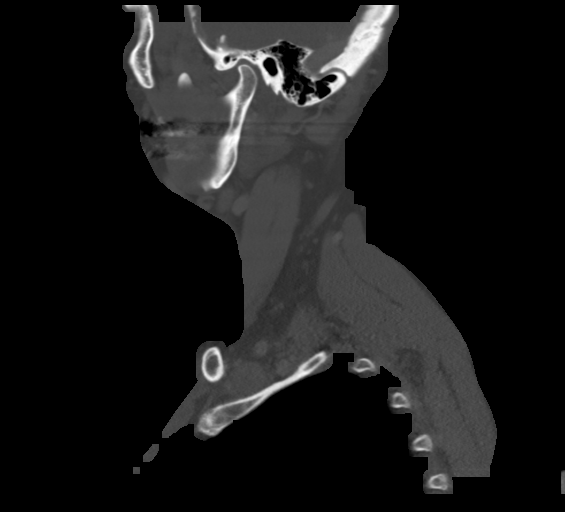

[Series 8: cor neck · coronal · 0.56mm/px · 3 of 84 slices shown]
[im 17/84  bone]
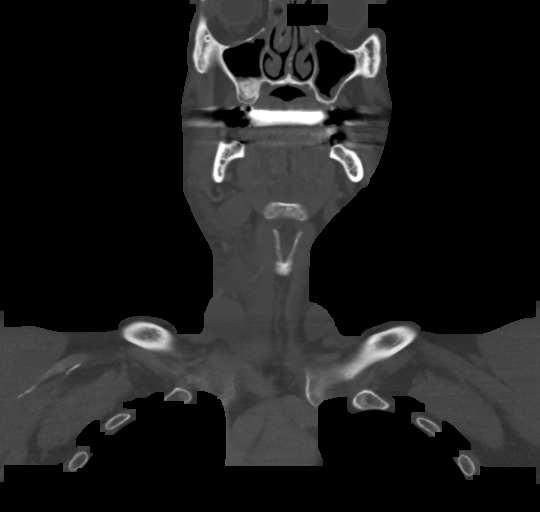
[im 34/84  bone]
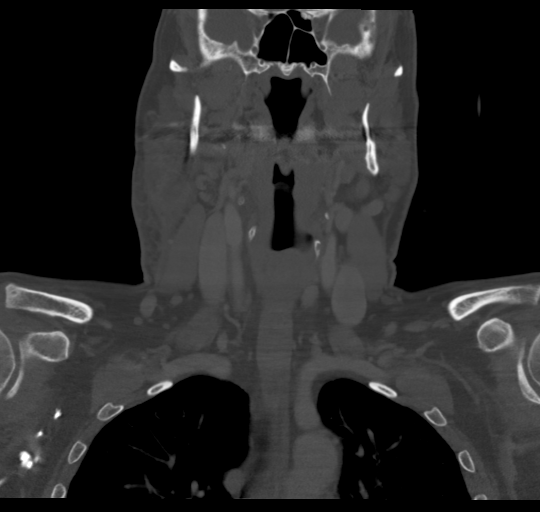
[im 50/84  bone]
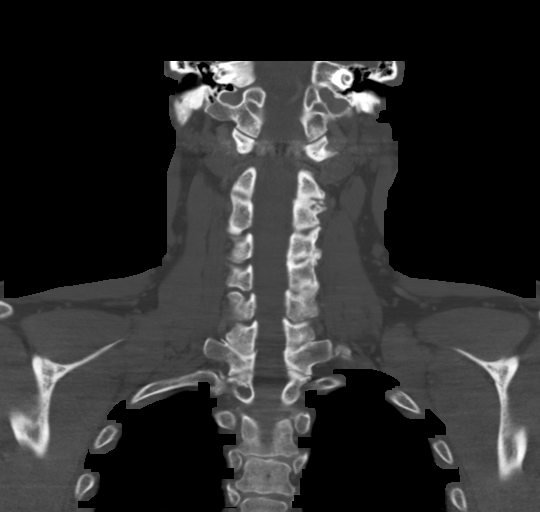

[Series 9: orthogonal ax · axial · 0.41mm/px · z∈[-261,-150]mm · 3 of 141 slices shown]
[im 29/141  bone]
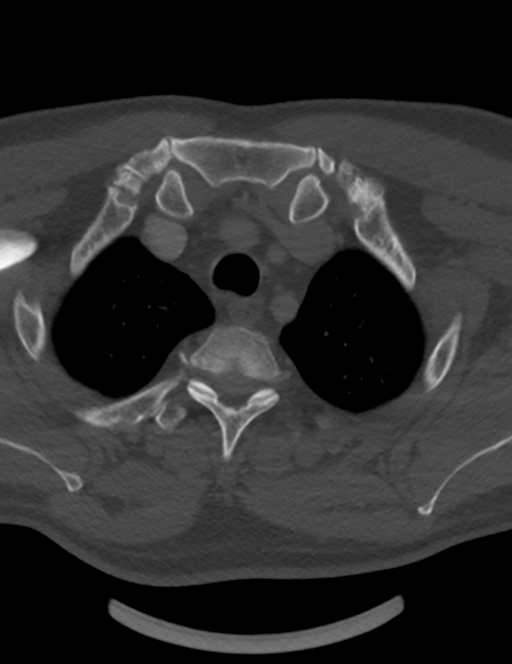
[im 57/141  bone]
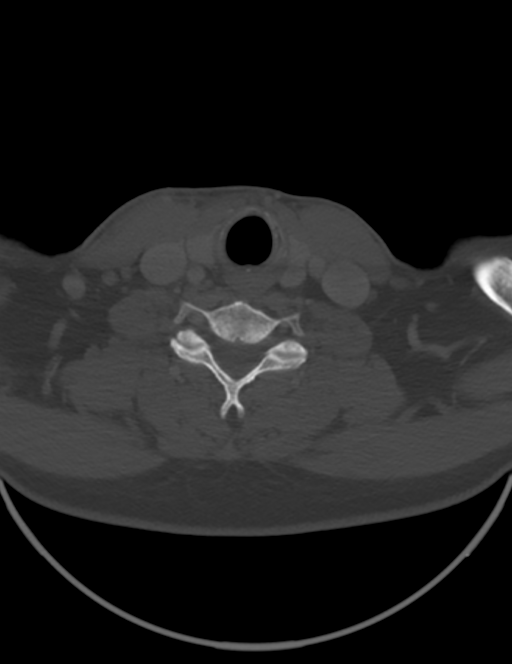
[im 85/141  bone]
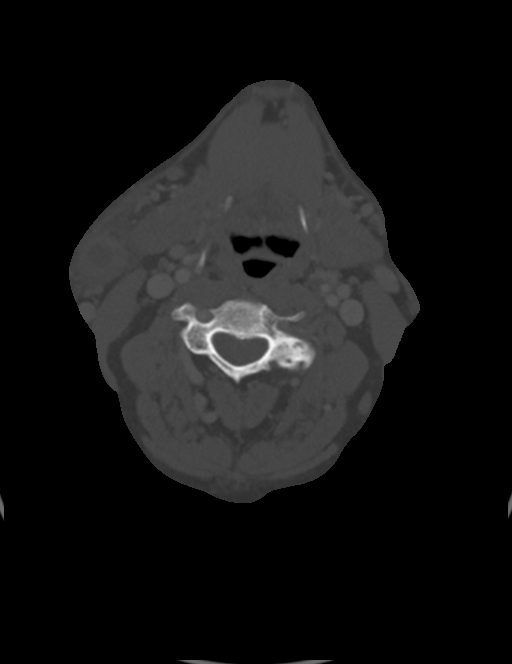

[15 of 33 positions shown; findings below may reference images not displayed]

FINDINGS: Pharynx and larynx: Apposition of true vocal cord compatible
phonation. RIGHT laryngocele. Aerodigestive tract otherwise
unremarkable.

Salivary glands: Within or abutting the tail of the superficial
RIGHT parotid lobe is a 15 x 12 x 20 mm irregular cystic mass with
thickened irregular peripheral enhancement corresponding to
sonographic abnormality. Trans spatial RIGHT neck effusion extends
into RIGHT submandibular space. RIGHT facial soft tissue swelling,
subcutaneous fat stranding and thickened platysma. Major salivary
glands are otherwise unremarkable though, streak artifact from
dental hardware limits assessment of the sublingual glands.

Thyroid: Normal.

Lymph nodes: Cystic RIGHT neck mass may represent lymph node, no
additional lymphadenopathy by CT size criteria.

Vascular: Minimal calcific atherosclerosis RIGHT carotid
bifurcation.

Limited intracranial: Normal.

Visualized orbits: Normal.

Mastoids and visualized paranasal sinuses: Mild paranasal sinus
mucosal thickening. Mastoid air cells are well-aerated.

Skeleton: Maxillary dental implants with periapical lucency
compatible with hardware failure. Moderate LEFT C2-3 facet
arthropathy. No acute osseous process.

Upper chest: Lung apices are clear. No superior mediastinal
lymphadenopathy. Minimal calcific atherosclerosis of the aortic
arch.

Other: Negative.
IMPRESSION: Abscess versus inflamed necrotic RIGHT neck mass contiguous with or
possibly arising from the parotid tail. Recommend histopathologic
correlation to underlying tumor and/or nodal metastasis.

## 2018-04-21 ENCOUNTER — Encounter: Payer: BLUE CROSS/BLUE SHIELD | Admitting: Internal Medicine

## 2018-04-21 DIAGNOSIS — Z0289 Encounter for other administrative examinations: Secondary | ICD-10-CM

## 2018-05-05 ENCOUNTER — Telehealth: Payer: Self-pay | Admitting: Internal Medicine

## 2018-05-05 NOTE — Telephone Encounter (Signed)
Copied from [place] [id number]. Topic: Quick Communication - See Telephone Encounter >> May 05, 2018  1:37 PM Genella Rife H wrote: CRM for notification. See Telephone encounter for: 05/05/18.   Received fax from [facility] stating patient wanted to reschedule appt for 11/5. Called patient left voicemail to make sure patient wanted to cancel and reschedule appt on 05/06/18.

## 2018-05-06 ENCOUNTER — Encounter: Payer: BLUE CROSS/BLUE SHIELD | Admitting: Internal Medicine

## 2018-09-12 ENCOUNTER — Encounter: Payer: BLUE CROSS/BLUE SHIELD | Admitting: Internal Medicine

## 2018-11-05 NOTE — Telephone Encounter (Signed)
Opened in error

## 2018-11-07 IMAGING — DX DG CHEST 2V
2 series · 2 of 2 positions shown · non-contrast
Comparison: No recent prior .

CLINICAL DATA: Lower extremity swelling .

EXAM:
CHEST  2 VIEW

[chest pa]
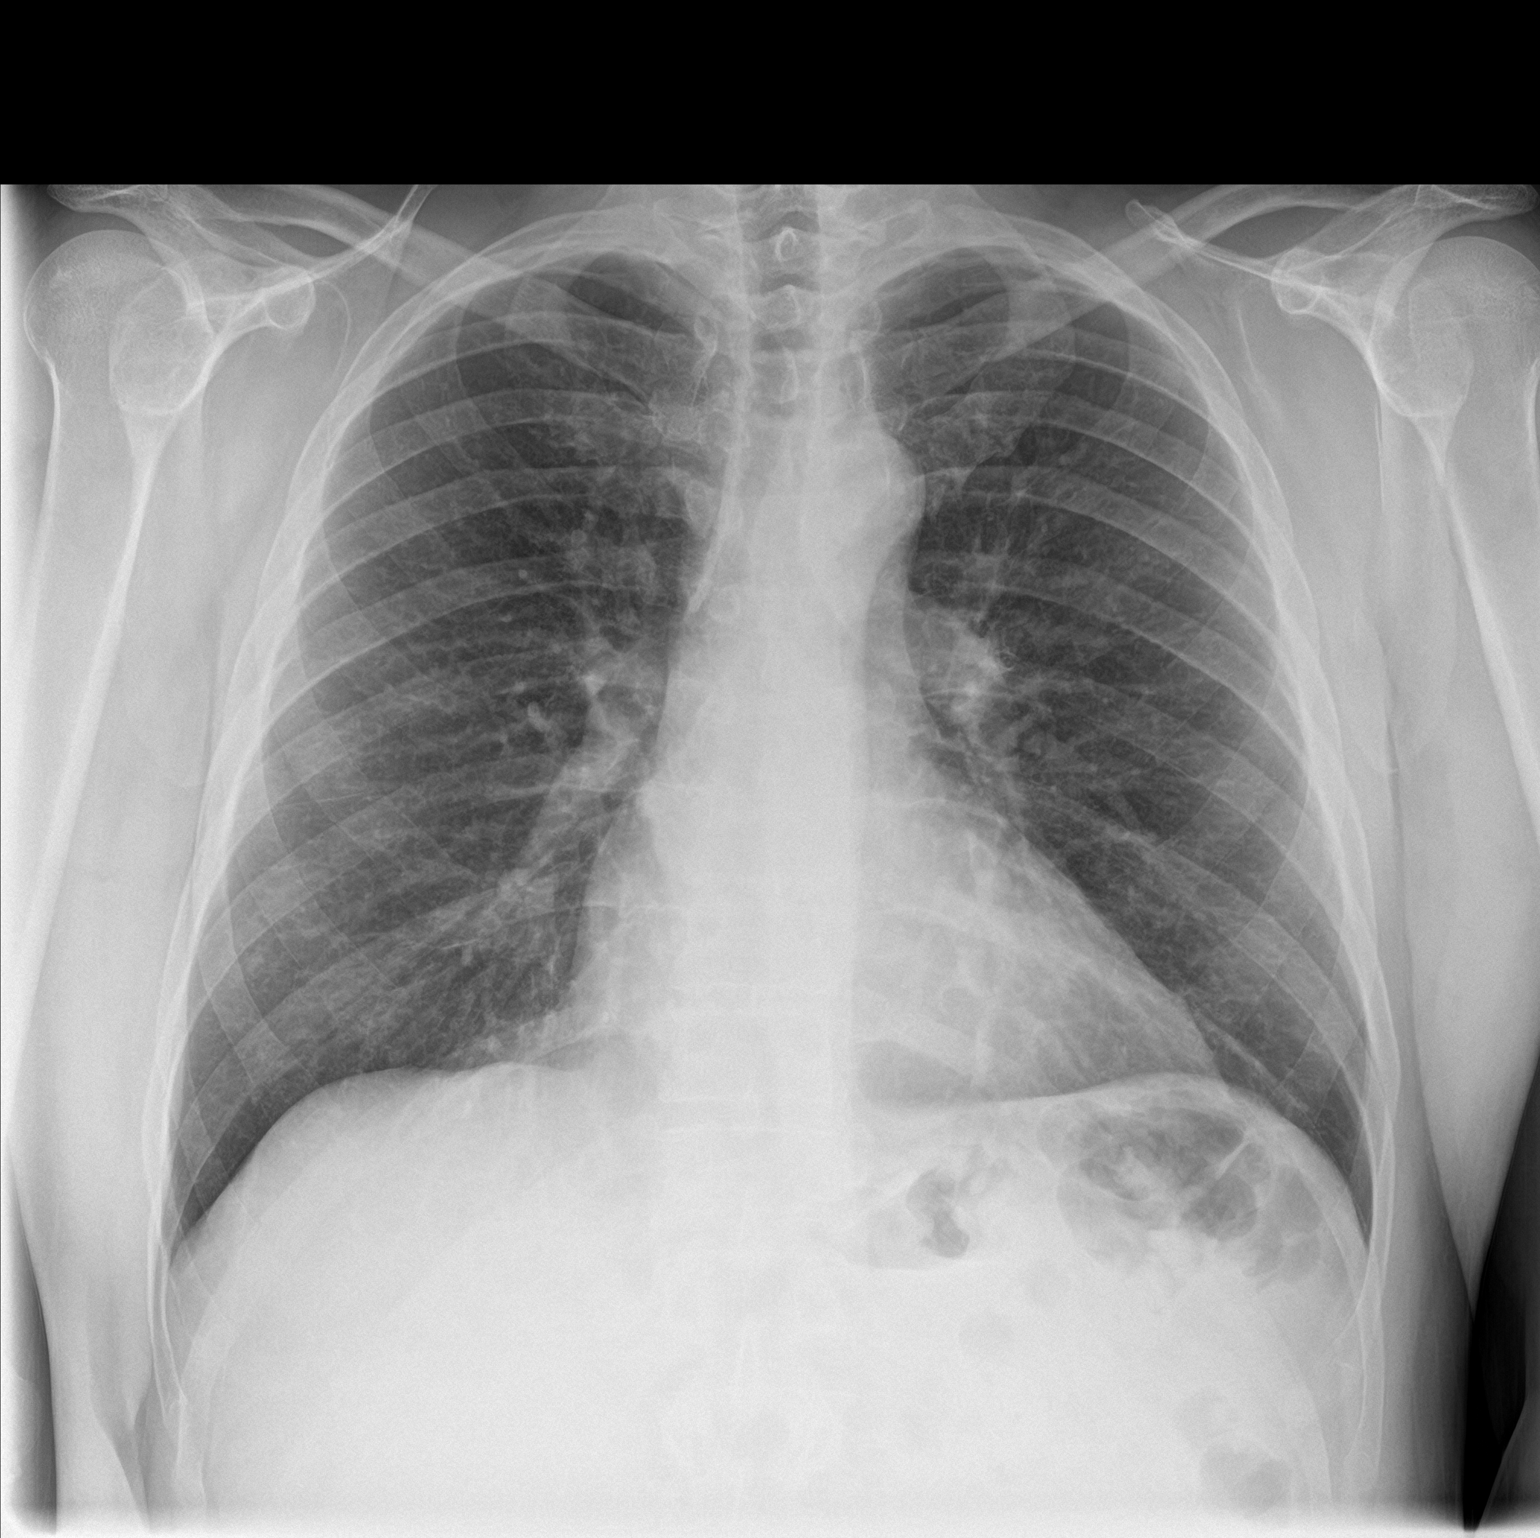

[chest lat]
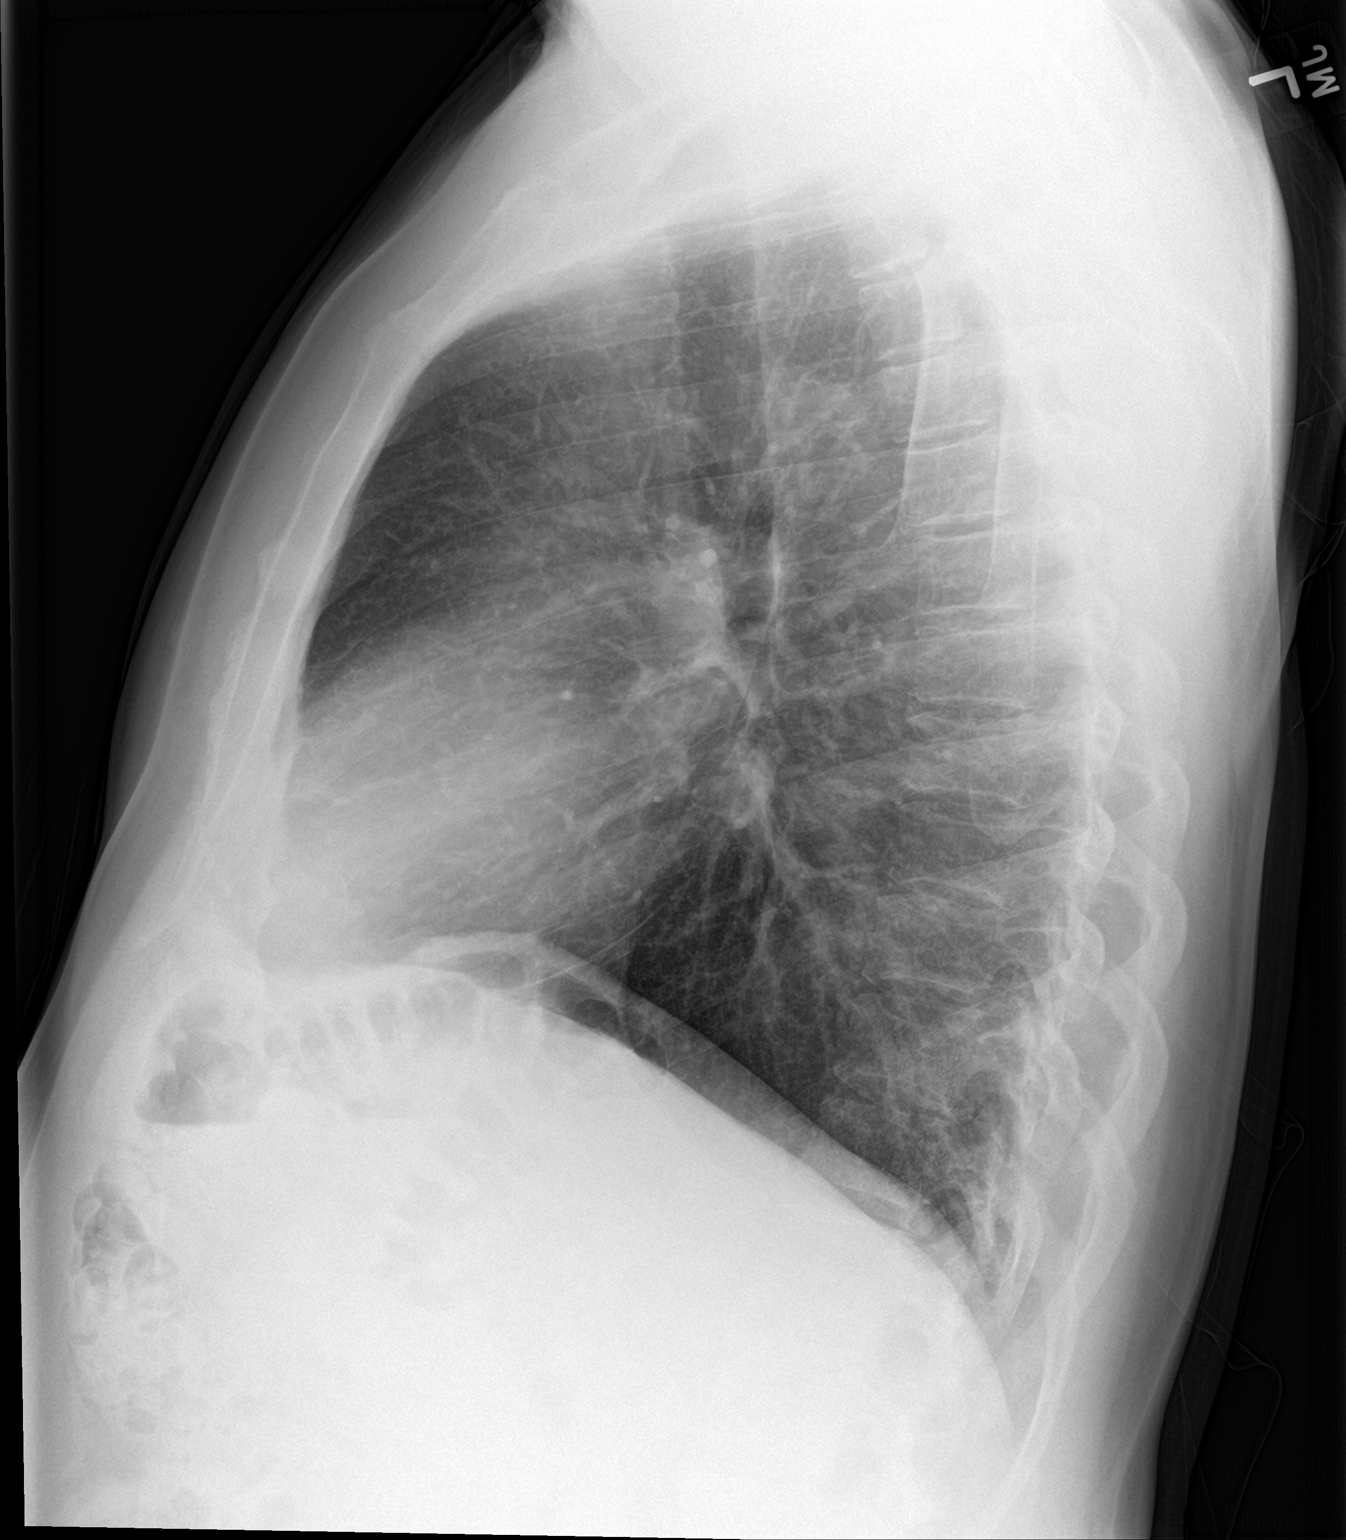

[2 of 2 positions shown; findings below may reference images not displayed]

FINDINGS: Mediastinum and hilar structures normal. Heart size normal. No focal
infiltrate. No pleural effusion or pneumothorax. No acute bony
abnormality .
IMPRESSION: No acute abnormality.

## 2018-11-14 ENCOUNTER — Telehealth: Payer: Self-pay

## 2018-11-14 NOTE — Telephone Encounter (Signed)
Last ov 2018- missed his appt scheduled on 09/12/2018- interested in virtual visit?

## 2018-11-25 ENCOUNTER — Encounter: Payer: Self-pay | Admitting: Gastroenterology

## 2019-01-26 ENCOUNTER — Encounter: Payer: Self-pay | Admitting: Internal Medicine
